# Patient Record
Sex: Female | Born: 1985 | Race: White | Hispanic: No | Marital: Single | Smoking: Current every day smoker
Health system: Southern US, Community
[De-identification: ages and names within clinical notes are randomized; demographics above are authoritative.]

## PROBLEM LIST (undated history)

## (undated) ENCOUNTER — Inpatient Hospital Stay (HOSPITAL_COMMUNITY): Payer: Self-pay

## (undated) DIAGNOSIS — B999 Unspecified infectious disease: Secondary | ICD-10-CM

## (undated) DIAGNOSIS — R87619 Unspecified abnormal cytological findings in specimens from cervix uteri: Secondary | ICD-10-CM

## (undated) DIAGNOSIS — N390 Urinary tract infection, site not specified: Secondary | ICD-10-CM

## (undated) DIAGNOSIS — IMO0002 Reserved for concepts with insufficient information to code with codable children: Secondary | ICD-10-CM

## (undated) HISTORY — PX: INDUCED ABORTION: SHX677

---
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
## 2005-01-04 ENCOUNTER — Ambulatory Visit (HOSPITAL_COMMUNITY): Admission: RE | Admit: 2005-01-04 | Discharge: 2005-01-04 | Payer: Self-pay | Admitting: Obstetrics & Gynecology

## 2005-04-25 ENCOUNTER — Ambulatory Visit (HOSPITAL_COMMUNITY): Admission: RE | Admit: 2005-04-25 | Discharge: 2005-04-25 | Payer: Self-pay | Admitting: Obstetrics & Gynecology

## 2005-06-02 ENCOUNTER — Inpatient Hospital Stay (HOSPITAL_COMMUNITY): Admission: AD | Admit: 2005-06-02 | Discharge: 2005-06-04 | Payer: Self-pay | Admitting: Obstetrics & Gynecology

## 2005-06-29 ENCOUNTER — Emergency Department (HOSPITAL_COMMUNITY): Admission: EM | Admit: 2005-06-29 | Discharge: 2005-06-29 | Payer: Self-pay | Admitting: Emergency Medicine

## 2005-11-22 ENCOUNTER — Emergency Department (HOSPITAL_COMMUNITY): Admission: EM | Admit: 2005-11-22 | Discharge: 2005-11-22 | Payer: Self-pay | Admitting: Emergency Medicine

## 2006-03-29 ENCOUNTER — Inpatient Hospital Stay (HOSPITAL_COMMUNITY): Admission: AD | Admit: 2006-03-29 | Discharge: 2006-03-29 | Payer: Self-pay | Admitting: Obstetrics & Gynecology

## 2007-08-18 ENCOUNTER — Emergency Department (HOSPITAL_COMMUNITY): Admission: EM | Admit: 2007-08-18 | Discharge: 2007-08-18 | Payer: Self-pay | Admitting: Family Medicine

## 2007-08-18 ENCOUNTER — Inpatient Hospital Stay (HOSPITAL_COMMUNITY): Admission: AD | Admit: 2007-08-18 | Discharge: 2007-08-19 | Payer: Self-pay | Admitting: Obstetrics & Gynecology

## 2008-02-13 ENCOUNTER — Inpatient Hospital Stay (HOSPITAL_COMMUNITY): Admission: AD | Admit: 2008-02-13 | Discharge: 2008-02-17 | Payer: Self-pay | Admitting: Obstetrics

## 2008-09-07 ENCOUNTER — Inpatient Hospital Stay (HOSPITAL_COMMUNITY): Admission: AD | Admit: 2008-09-07 | Discharge: 2008-09-08 | Payer: Self-pay | Admitting: Obstetrics & Gynecology

## 2009-06-21 ENCOUNTER — Ambulatory Visit (HOSPITAL_COMMUNITY): Admission: RE | Admit: 2009-06-21 | Discharge: 2009-06-21 | Payer: Self-pay | Admitting: Obstetrics

## 2011-03-31 NOTE — Discharge Summary (Signed)
NAMETIFFINE, HENIGAN NO.:  [id number]   MEDICAL RECORD NO.:  [id number]          PATIENT TYPE:  INP   LOCATION:  9317                          FACILITY:  [facility]   PHYSICIAN:  Roseanna Rainbow, M.D.DATE OF BIRTH:  [birth year]   DATE OF ADMISSION:  02/13/2008  DATE OF DISCHARGE:  02/17/2008                               DISCHARGE SUMMARY   CHIEF COMPLAINT:  The patient is a 24 year old gravida 4, para 2-0-2-2  with lower abdominal pain.   HISTORY OF PRESENT ILLNESS:  She reports a several-day history of  similar complaints.  She denies fever.   SOCIAL HISTORY:  She reports one-half pack per day tobacco use and  occasional marijuana use.   FAMILY HISTORY:  Noncontributory.   ALLERGIES:  No known drug allergies.   PHYSICAL EXAMINATION:  GENERAL:  Well-developed, well-nourished, no  apparent distress.  HEENT:  Normocephalic and atraumatic.  NECK:  Supple.  No thyromegaly.  LUNGS:  Clear to auscultation bilaterally.  HEART:  Regular rate and rhythm.  ABDOMEN:  Tender throughout.  PELVIC:  Deferred.  EXTREMITIES:  No clubbing, cyanosis, or edema.  SKIN:  Without rash.  NEUROLOGIC:  Nonfocal.   LABORATORY DATA:  White blood cell count 19,000.   ASSESSMENT:  Pelvic inflammatory disease.   PLAN:  Admission and parenteral antibiotics.  The patient was admitted  and started on ampicillin parenterally.  This regimen was then changed  to cefoxitin and doxycycline.  Clinically, she improved.  She remained  afebrile.  On February 14, 2008, pelvic ultrasound demonstrated a small  amount of fluid within the endometrial canal; otherwise unremarkable.  A  GC DNA probe was positive.  The Chlamydia probe was negative.  On wet  prep, there were many WBCs.  She denied an HIV test as well as an RPR.  She was counseled regarding contraception as well.  She was discharged  to home on February 17, 2008, with an improvement in the previously  documented leukocytosis.   DISCHARGE  DIAGNOSIS:  Pelvic inflammatory disease, acute.   CONDITION:  Improved.   DIET:  Regular.   ACTIVITY:  Pelvic rest.   MEDICATIONS:  Doxycycline, Flagyl, and Loestrin.   DISPOSITION:  The patient was to follow up in the office in 2 weeks.      Roseanna Rainbow, M.D.  Electronically Signed     LAJ/MEDQ  D:  03/07/2008  T:  03/08/2008  Job:  [id number]

## 2011-08-08 LAB — WET PREP, GENITAL
Clue Cells Wet Prep HPF POC: NONE SEEN
Trich, Wet Prep: NONE SEEN

## 2011-08-08 LAB — GC/CHLAMYDIA PROBE AMP, GENITAL
Chlamydia, DNA Probe: NEGATIVE
GC Probe Amp, Genital: POSITIVE — AB

## 2011-08-08 LAB — URINALYSIS, ROUTINE W REFLEX MICROSCOPIC
Nitrite: NEGATIVE
Protein, ur: 30 — AB
Urobilinogen, UA: 0.2

## 2011-08-08 LAB — CBC
MCHC: 33.7
MCV: 80.2
RBC: 4.53
RDW: 15.7 — ABNORMAL HIGH
WBC: 19.3 — ABNORMAL HIGH

## 2011-08-08 LAB — DIFFERENTIAL
Basophils Absolute: 0
Basophils Relative: 0
Eosinophils Absolute: 0
Monocytes Absolute: 0.6
Monocytes Relative: 3
Neutro Abs: 18 — ABNORMAL HIGH
Neutrophils Relative %: 93 — ABNORMAL HIGH

## 2011-08-14 LAB — URINALYSIS, ROUTINE W REFLEX MICROSCOPIC
Glucose, UA: NEGATIVE
Hgb urine dipstick: NEGATIVE
Ketones, ur: NEGATIVE
Protein, ur: NEGATIVE

## 2011-08-14 LAB — WET PREP, GENITAL: Yeast Wet Prep HPF POC: NONE SEEN

## 2011-08-14 LAB — GC/CHLAMYDIA PROBE AMP, GENITAL
Chlamydia, DNA Probe: POSITIVE — AB
GC Probe Amp, Genital: NEGATIVE

## 2011-08-14 LAB — URINE MICROSCOPIC-ADD ON

## 2011-08-24 LAB — URINALYSIS, ROUTINE W REFLEX MICROSCOPIC
Bilirubin Urine: NEGATIVE
Glucose, UA: NEGATIVE
Hgb urine dipstick: NEGATIVE
Ketones, ur: NEGATIVE
pH: 6.5

## 2011-08-24 LAB — POCT PREGNANCY, URINE
Operator id: 251141
Preg Test, Ur: NEGATIVE

## 2011-08-24 LAB — URINE MICROSCOPIC-ADD ON

## 2011-08-24 LAB — WET PREP, GENITAL: Yeast Wet Prep HPF POC: NONE SEEN

## 2011-08-24 LAB — GC/CHLAMYDIA PROBE AMP, GENITAL
Chlamydia, DNA Probe: NEGATIVE
GC Probe Amp, Genital: NEGATIVE

## 2011-12-18 ENCOUNTER — Encounter (HOSPITAL_COMMUNITY): Payer: Self-pay

## 2011-12-18 ENCOUNTER — Emergency Department (HOSPITAL_COMMUNITY)
Admission: EM | Admit: 2011-12-18 | Discharge: 2011-12-18 | Disposition: A | Discharge status: Home / Self Care | Payer: Self-pay | Attending: Emergency Medicine | Admitting: Emergency Medicine

## 2011-12-18 ENCOUNTER — Emergency Department (HOSPITAL_COMMUNITY): Payer: Self-pay

## 2011-12-18 ENCOUNTER — Other Ambulatory Visit: Payer: Self-pay

## 2011-12-18 DIAGNOSIS — R0789 Other chest pain: Secondary | ICD-10-CM | POA: Insufficient documentation

## 2011-12-18 DIAGNOSIS — F17200 Nicotine dependence, unspecified, uncomplicated: Secondary | ICD-10-CM | POA: Insufficient documentation

## 2011-12-18 DIAGNOSIS — M940 Chondrocostal junction syndrome [Tietze]: Secondary | ICD-10-CM | POA: Insufficient documentation

## 2011-12-18 LAB — URINALYSIS, ROUTINE W REFLEX MICROSCOPIC
Glucose, UA: NEGATIVE mg/dL
Ketones, ur: NEGATIVE mg/dL
Leukocytes, UA: NEGATIVE
pH: 6.5 (ref 5.0–8.0)

## 2011-12-18 LAB — URINE MICROSCOPIC-ADD ON

## 2011-12-18 LAB — POCT PREGNANCY, URINE: Preg Test, Ur: NEGATIVE

## 2011-12-18 MED ORDER — KETOROLAC TROMETHAMINE 30 MG/ML IJ SOLN
30.0000 mg | Freq: Once | INTRAMUSCULAR | Status: AC
  Administered 2011-12-18: 30 mg via INTRAMUSCULAR
  Filled 2011-12-18: qty 1

## 2011-12-18 MED ORDER — IBUPROFEN 800 MG PO TABS: 800.0000 mg | ORAL_TABLET | Freq: Three times a day (TID) | ORAL | Status: AC
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
## 2011-12-18 NOTE — ED Notes (Signed)
Introduced self to pt and family.
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
## 2011-12-19 NOTE — ED Provider Notes (Signed)
Medical screening examination/treatment/procedure(s) were performed by non-physician practitioner and as supervising physician I was immediately available for consultation/collaboration.   Julyan Gales, MD 12/19/11 0715 

## 2012-01-12 NOTE — ED Provider Notes (Signed)
Medical screening examination/treatment/procedure(s) were performed by non-physician practitioner and as supervising physician I was immediately available for consultation/collaboration.  Geoffery Lyons, MD 01/12/12 [phone]

## 2012-02-01 ENCOUNTER — Encounter (HOSPITAL_COMMUNITY): Payer: Self-pay | Admitting: *Deleted

## 2012-02-01 ENCOUNTER — Inpatient Hospital Stay (HOSPITAL_COMMUNITY)
Admission: AD | Admit: 2012-02-01 | Discharge: 2012-02-01 | Disposition: A | Discharge status: Home / Self Care | Payer: Self-pay | Attending: Obstetrics & Gynecology | Admitting: Obstetrics & Gynecology

## 2012-02-01 DIAGNOSIS — N949 Unspecified condition associated with female genital organs and menstrual cycle: Secondary | ICD-10-CM | POA: Insufficient documentation

## 2012-02-01 DIAGNOSIS — A499 Bacterial infection, unspecified: Secondary | ICD-10-CM | POA: Insufficient documentation

## 2012-02-01 DIAGNOSIS — B9689 Other specified bacterial agents as the cause of diseases classified elsewhere: Secondary | ICD-10-CM | POA: Insufficient documentation

## 2012-02-01 DIAGNOSIS — L732 Hidradenitis suppurativa: Secondary | ICD-10-CM | POA: Insufficient documentation

## 2012-02-01 DIAGNOSIS — N760 Acute vaginitis: Secondary | ICD-10-CM | POA: Insufficient documentation

## 2012-02-01 LAB — URINALYSIS, ROUTINE W REFLEX MICROSCOPIC
Ketones, ur: NEGATIVE mg/dL
Leukocytes, UA: NEGATIVE
Nitrite: NEGATIVE
Protein, ur: NEGATIVE mg/dL
pH: 6 (ref 5.0–8.0)

## 2012-02-01 LAB — POCT PREGNANCY, URINE: Preg Test, Ur: NEGATIVE

## 2012-02-01 MED ORDER — METRONIDAZOLE 500 MG PO TABS: 500.0000 mg | ORAL_TABLET | Freq: Two times a day (BID) | ORAL | Status: AC

## 2012-02-01 NOTE — MAU Provider Note (Signed)
History   Rebecca [name] is a 26 y.o. year old [id number] female who presents to MAU reporting increased vaginal discharge, intermittent bilat low-mid abd pain and boils on inner thighs. She reports Hx of similar lesions in axilla.    CSN: [id number]  Arrival date & time 02/01/12  0208   None     Chief Complaint  Patient presents with  . Abdominal Pain  . Vaginal Discharge  . Recurrent Skin Infections    (Consider location/radiation/quality/duration/timing/severity/associated sxs/prior treatment) HPI  Past Medical History  Diagnosis Date  . No pertinent past medical history     No past surgical history on file.  Family History  Problem Relation Age of Onset  . Hypertension Mother   . Diabetes Father     History  Substance Use Topics  . Smoking status: Current Everyday Smoker -- 0.5 packs/day  . Smokeless tobacco: Not on file  . Alcohol Use: Yes     socially    OB History    Grav Para Term Preterm Abortions TAB SAB Ect Mult Living   6 2 2  0 4 3 1  0 0 2      Review of Systems  Constitutional: Negative for chills and fatigue.  Gastrointestinal: Positive for abdominal pain. Negative for nausea, vomiting, diarrhea and constipation.  Genitourinary: Positive for vaginal discharge and pelvic pain. Negative for dysuria, hematuria, flank pain, vaginal bleeding and menstrual problem.    Allergies  Review of patient's allergies indicates no known allergies.  Home Medications  No current outpatient prescriptions on file.  BP 143/76  Pulse 85  Temp(Src) 98.4 F (36.9 C) (Oral)  Resp 16  Ht 5\' 6"  (1.676 m)  Wt 82.01 kg (180 lb 12.8 oz)  BMI 29.18 kg/m2  LMP 01/11/2012  Physical Exam  Constitutional: She appears well-developed and well-nourished. No distress.  Cardiovascular: Normal rate.   Pulmonary/Chest: Effort normal.  Abdominal: Soft. Bowel sounds are normal. She exhibits no distension and no mass. There is tenderness. There is no rebound, no guarding, no  CVA tenderness and no tenderness at McBurney's point.    Genitourinary: There is no rash on the right labia. There is no rash on the left labia. Uterus is not enlarged and not tender. Cervix exhibits no motion tenderness, no discharge and no friability. Right adnexum displays no mass, no tenderness and no fullness. Left adnexum displays no mass, no tenderness and no fullness. No bleeding around the vagina. Vaginal discharge (thin, white, malodorous discharge) found.  Lymphadenopathy:       Right: No inguinal adenopathy present.       Left: No inguinal adenopathy present.  Skin: Skin is warm and dry. Lesion noted.       2 firm erythematous raised lesions 1. 1 cm on right inner thigh 2. 1.5 x 4 cm on right buttock    ED Course  Procedures (including critical care time)  Labs Reviewed  WET PREP, GENITAL - Abnormal; Notable for the following:    Clue Cells Wet Prep HPF POC FEW (*)    WBC, Wet Prep HPF POC FEW (*) MODERATE BACTERIA SEEN   All other components within normal limits  URINALYSIS, ROUTINE W REFLEX MICROSCOPIC  GC/CHLAMYDIA PROBE AMP, GENITAL   1. BV (bacterial vaginosis)   2. Hidradenitis suppurativa     MDM  Rx Flagyl Frequent warm soaks F/U at [facility] PRN for no improvement of Sx.  Dorathy Kinsman 02/01/2012 3:46 AM

## 2012-02-01 NOTE — Discharge Instructions (Signed)
Bacterial Vaginosis Bacterial vaginosis (BV) is a vaginal infection where the normal balance of bacteria in the vagina is disrupted. The normal balance is then replaced by an overgrowth of certain bacteria. There are several different kinds of bacteria that can cause BV. BV is the most common vaginal infection in women of childbearing age. CAUSES   The cause of BV is not fully understood. BV develops when there is an increase or imbalance of harmful bacteria.   Some activities or behaviors can upset the normal balance of bacteria in the vagina and put women at increased risk including:   Having a new sex partner or multiple sex partners.   Douching.   Using an intrauterine device (IUD) for contraception.   It is not clear what role sexual activity plays in the development of BV. However, women that have never had sexual intercourse are rarely infected with BV.  Women do not get BV from toilet seats, bedding, swimming pools or from touching objects around them.  SYMPTOMS   Grey vaginal discharge.   A fish-like odor with discharge, especially after sexual intercourse.   Itching or burning of the vagina and vulva.   Burning or pain with urination.   Some women have no signs or symptoms at all.  DIAGNOSIS  Your caregiver must examine the vagina for signs of BV. Your caregiver will perform lab tests and look at the sample of vaginal fluid through a microscope. They will look for bacteria and abnormal cells (clue cells), a pH test higher than 4.5, and a positive amine test all associated with BV.  RISKS AND COMPLICATIONS   Pelvic inflammatory disease (PID).   Infections following gynecology surgery.   Developing HIV.   Developing herpes virus.  TREATMENT  Sometimes BV will clear up without treatment. However, all women with symptoms of BV should be treated to avoid complications, especially if gynecology surgery is planned. Female partners generally do not need to be treated. However,  BV may spread between female sex partners so treatment is helpful in preventing a recurrence of BV.   BV may be treated with antibiotics. The antibiotics come in either pill or vaginal cream forms. Either can be used with nonpregnant or pregnant women, but the recommended dosages differ. These antibiotics are not harmful to the baby.   BV can recur after treatment. If this happens, a second round of antibiotics will often be prescribed.   Treatment is important for pregnant women. If not treated, BV can cause a premature delivery, especially for a pregnant woman who had a premature birth in the past. All pregnant women who have symptoms of BV should be checked and treated.   For chronic reoccurrence of BV, treatment with a type of prescribed gel vaginally twice a week is helpful.  HOME CARE INSTRUCTIONS   Finish all medication as directed by your caregiver.   Do not have sex until treatment is completed.   Tell your sexual partner that you have a vaginal infection. They should see their caregiver and be treated if they have problems, such as a mild rash or itching.   Practice safe sex. Use condoms. Only have 1 sex partner.  PREVENTION  Basic prevention steps can help reduce the risk of upsetting the natural balance of bacteria in the vagina and developing BV:  Do not have sexual intercourse (be abstinent).   Do not douche.   Use all of the medicine prescribed for treatment of BV, even if the signs and symptoms go away.     Tell your sex partner if you have BV. That way, they can be treated, if needed, to prevent reoccurrence.  SEEK MEDICAL CARE IF:   Your symptoms are not improving after 3 days of treatment.   You have increased discharge, pain, or fever.  MAKE SURE YOU:   Understand these instructions.   Will watch your condition.   Will get help right away if you are not doing well or get worse.  FOR MORE INFORMATION  Division of STD Prevention (DSTDP), Centers for Disease  Control and Prevention: www.cdc.gov/std American Social Health Association (ASHA): www.ashastd.org  Document Released: 10/30/2005 Document Revised: 10/19/2011 Document Reviewed: 04/22/2009 Exit[employer] Patient Information 2012 [employer]. 

## 2012-02-01 NOTE — MAU Note (Signed)
Pt states she has been having abdominal pain for about a month. Pt states she is having sharp pains in the stomach. Pt also complains for boils in her inner thigh that are big.

## 2012-02-01 NOTE — Progress Notes (Signed)
Cultures collected and internal vaginal exam performed

## 2012-02-01 NOTE — MAU Note (Signed)
Pt LMP 01/11/12, having abd pain x 1 month, vag discharge and 2 boils on the inside of right thigh.  No hx of MRSA.  G6 P2.

## 2012-12-19 ENCOUNTER — Inpatient Hospital Stay (HOSPITAL_COMMUNITY)
Admission: AD | Admit: 2012-12-19 | Discharge: 2012-12-19 | Disposition: A | Discharge status: Home / Self Care | Payer: Self-pay | Source: Ambulatory Visit | Attending: Obstetrics | Admitting: Obstetrics

## 2012-12-19 ENCOUNTER — Encounter (HOSPITAL_COMMUNITY): Payer: Self-pay | Admitting: *Deleted

## 2012-12-19 DIAGNOSIS — N72 Inflammatory disease of cervix uteri: Secondary | ICD-10-CM | POA: Insufficient documentation

## 2012-12-19 DIAGNOSIS — N949 Unspecified condition associated with female genital organs and menstrual cycle: Secondary | ICD-10-CM | POA: Insufficient documentation

## 2012-12-19 DIAGNOSIS — R109 Unspecified abdominal pain: Secondary | ICD-10-CM | POA: Insufficient documentation

## 2012-12-19 HISTORY — DX: Urinary tract infection, site not specified: N39.0

## 2012-12-19 HISTORY — DX: Reserved for concepts with insufficient information to code with codable children: IMO0002

## 2012-12-19 HISTORY — DX: Unspecified abnormal cytological findings in specimens from cervix uteri: R87.619

## 2012-12-19 HISTORY — DX: Unspecified infectious disease: B99.9

## 2012-12-19 LAB — URINALYSIS, ROUTINE W REFLEX MICROSCOPIC
Leukocytes, UA: NEGATIVE
Nitrite: NEGATIVE
Specific Gravity, Urine: 1.01 (ref 1.005–1.030)
pH: 6 (ref 5.0–8.0)

## 2012-12-19 LAB — WET PREP, GENITAL
Trich, Wet Prep: NONE SEEN
Yeast Wet Prep HPF POC: NONE SEEN

## 2012-12-19 MED ORDER — AZITHROMYCIN 1 G PO PACK
1.0000 g | PACK | Freq: Once | ORAL | Status: AC
  Administered 2012-12-19: 1 g via ORAL
  Filled 2012-12-19: qty 1

## 2012-12-19 NOTE — MAU Provider Note (Signed)
History    CSN: [id number]  Arrival date and time: 12/19/12 1013   First Provider Initiated Contact with Patient 12/19/12 1114      Chief Complaint  Patient presents with  . Abdominal Pain  . Vaginal Discharge   HPI Rosilyn Coachman is a 27 year old [id number] female who presents to the MAU complaining of abdominal cramping, vaginal discharge, and foul vaginal odor. Patient reports that for the last month she has had some lower abdominal cramping and clear vaginal discharge. The abdominal cramping comes and goes and can be dull or more severe. She reports that she noticed the foul vaginal odor a week ago.  She reports that she has been diagnosed with BV within the last year and has been diagnosed with BV 4-5 times in her lifetime, and is concerned that this is another episode. Patient has taken Flagyl before with only mild stomach upset. She denies any vaginal bleeding and reports a normal menstrual period on January 19th. She has one partner, and is sexually active, and is not confident on the sexual activity of her partner.  She denies any pain with urination or increased frequency. She denies fevers, nausea, vomiting, or back pain. She reports that her last pap smear was in 2012, and the results came back abnormal. Her provider recommended she have a biopsy, but the patient has not had this done.   OB History    Grav Para Term Preterm Abortions TAB SAB Ect Mult Living   6 2 2  0 4 3 1  0 0 2      Past Medical History  Diagnosis Date  . Urinary tract infection   . Abnormal Pap smear   . Infection     trich, chlamydia    Past Surgical History  Procedure Date  . Induced abortion     Family History  Problem Relation Age of Onset  . Hypertension Mother   . Diabetes Father   . Other Neg Hx     History  Substance Use Topics  . Smoking status: Current Every Day Smoker -- 0.5 packs/day for 8 years    Types: Cigarettes  . Smokeless tobacco: Never Used  . Alcohol Use: Yes   Comment: socially    Allergies: No Known Allergies  Prescriptions prior to admission  Medication Sig Dispense Refill  . GARCINIA CAMBOGIA-CHROMIUM PO Take 1 tablet by mouth daily.      Marland Kitchen ibuprofen (ADVIL,MOTRIN) 200 MG tablet Take 400 mg by mouth every 6 (six) hours as needed. For pain        Review of Systems  Constitutional: Negative for fever.  Gastrointestinal: Positive for abdominal pain. Negative for nausea, vomiting, diarrhea and constipation.  Genitourinary: Negative for dysuria and urgency.  Musculoskeletal: Positive for back pain.    Physical Exam   Blood pressure 127/72, pulse 72, temperature 97 F (36.1 C), temperature source Oral, resp. rate 16, height 5\' 5"  (1.651 m), weight 82.01 kg (180 lb 12.8 oz), last menstrual period 12/01/2012, SpO2 98.00%.  Physical Exam Physical Examination: General appearance - alert, well appearing, and in no distress and oriented to person, place, and time Abdomen - soft, nondistended, no masses or organomegaly mild pelvic tenderness noted  Pelvic - normal external genitalia, vulva, vagina, cervix, uterus and adnexa, VULVA: normal appearing vulva with no masses, tenderness or lesions VAGINA: normal appearing vagina with normal color, white/clear discharge present, no lesions, CERVIX: normal appearing cervix without discharge or lesions, DNA probe for chlamydia and GC obtained, multiparous os.  Cx friable with spotting on exam Back exam - tenderness on palpation, no CVA tenderness Extremities - peripheral pulses normal, no pedal edema, no clubbing or cyanosis  MAU Course  Procedures  Results for orders placed during the hospital encounter of 12/19/12 (from the past 24 hour(s))  URINALYSIS, ROUTINE W REFLEX MICROSCOPIC     Status: Normal   Collection Time   12/19/12 10:30 AM      Component Value Range   Color, Urine YELLOW  YELLOW   APPearance CLEAR  CLEAR   Specific Gravity, Urine 1.010  1.005 - 1.030   pH 6.0  5.0 - 8.0   Glucose, UA  NEGATIVE  NEGATIVE mg/dL   Hgb urine dipstick NEGATIVE  NEGATIVE   Bilirubin Urine NEGATIVE  NEGATIVE   Ketones, ur NEGATIVE  NEGATIVE mg/dL   Protein, ur NEGATIVE  NEGATIVE mg/dL   Urobilinogen, UA 0.2  0.0 - 1.0 mg/dL   Nitrite NEGATIVE  NEGATIVE   Leukocytes, UA NEGATIVE  NEGATIVE  POCT PREGNANCY, URINE     Status: Normal   Collection Time   12/19/12 10:38 AM      Component Value Range   Preg Test, Ur NEGATIVE  NEGATIVE  WET PREP, GENITAL     Status: Abnormal   Collection Time   12/19/12 12:05 PM      Component Value Range   Yeast Wet Prep HPF POC NONE SEEN  NONE SEEN   Trich, Wet Prep NONE SEEN  NONE SEEN   Clue Cells Wet Prep HPF POC NONE SEEN  NONE SEEN   WBC, Wet Prep HPF POC FEW (*) NONE SEEN    GC/CT sent Assessment and Plan  Vaginal discharge Cervicitis> pt. Elects presumptive tx for CT with Zithromax 1 gm po given Danae Orleans, CNM 12/19/2012 7:12 PM  Evaluation and management procedures were performed by PA-S under my supervision/collaboration. Chart reviewed, patient examined by me and I agree with management and plan.  Adela Glimpse 12/19/2012, 11:25 AM   Note to WOC for F/U appt re report of abnormal Pap done at [facility] without F/U. (Wants to F/U at [facility]).

## 2012-12-19 NOTE — MAU Note (Signed)
Pain in lower abd for past month, it is random, right now a 3, sometimes a punch of 10.  Had a d/c for a little over a wk.

## 2012-12-19 NOTE — MAU Note (Signed)
Patient states she has a history of bacterial vaginosis. States she has been having abdominal pain for about one month and having a discharge. Feels like BV.

## 2013-01-02 ENCOUNTER — Telehealth: Payer: Self-pay | Admitting: Obstetrics and Gynecology

## 2013-01-02 NOTE — Telephone Encounter (Signed)
States last Pap abnormal [facility] and was told to have biopsy so needs F/U. Please get record and schedule for colpo or Pap as indicated. They wouldn't see her without payment and I think she said she lost her insurance or MC. No ROI.Called patient so she could come in and fill out ROI in order to make her a F/U appointment.

## 2013-02-20 ENCOUNTER — Encounter (HOSPITAL_COMMUNITY): Payer: Self-pay | Admitting: *Deleted

## 2013-02-20 ENCOUNTER — Emergency Department (HOSPITAL_COMMUNITY)
Admission: EM | Admit: 2013-02-20 | Discharge: 2013-02-20 | Disposition: A | Discharge status: Home / Self Care | Payer: Self-pay | Attending: Emergency Medicine | Admitting: Emergency Medicine

## 2013-02-20 DIAGNOSIS — J029 Acute pharyngitis, unspecified: Secondary | ICD-10-CM | POA: Insufficient documentation

## 2013-02-20 DIAGNOSIS — J069 Acute upper respiratory infection, unspecified: Secondary | ICD-10-CM | POA: Insufficient documentation

## 2013-02-20 DIAGNOSIS — H9209 Otalgia, unspecified ear: Secondary | ICD-10-CM | POA: Insufficient documentation

## 2013-02-20 DIAGNOSIS — J3489 Other specified disorders of nose and nasal sinuses: Secondary | ICD-10-CM | POA: Insufficient documentation

## 2013-02-20 DIAGNOSIS — Z8619 Personal history of other infectious and parasitic diseases: Secondary | ICD-10-CM | POA: Insufficient documentation

## 2013-02-20 DIAGNOSIS — R05 Cough: Secondary | ICD-10-CM | POA: Insufficient documentation

## 2013-02-20 DIAGNOSIS — F17200 Nicotine dependence, unspecified, uncomplicated: Secondary | ICD-10-CM | POA: Insufficient documentation

## 2013-02-20 DIAGNOSIS — R059 Cough, unspecified: Secondary | ICD-10-CM | POA: Insufficient documentation

## 2013-02-20 DIAGNOSIS — Z87440 Personal history of urinary (tract) infections: Secondary | ICD-10-CM | POA: Insufficient documentation

## 2013-02-20 DIAGNOSIS — R51 Headache: Secondary | ICD-10-CM | POA: Insufficient documentation

## 2013-02-20 MED ORDER — ALBUTEROL SULFATE (5 MG/ML) 0.5% IN NEBU
2.5000 mg | INHALATION_SOLUTION | Freq: Once | RESPIRATORY_TRACT | Status: AC
  Administered 2013-02-20: 2.5 mg via RESPIRATORY_TRACT
  Filled 2013-02-20: qty 0.5

## 2013-02-20 MED ORDER — IBUPROFEN 400 MG PO TABS
400.0000 mg | ORAL_TABLET | Freq: Once | ORAL | Status: AC
  Administered 2013-02-20: 400 mg via ORAL
  Filled 2013-02-20: qty 1

## 2013-02-20 MED ORDER — HYDROCODONE-HOMATROPINE 5-1.5 MG/5ML PO SYRP: 5.0000 mL | ORAL_SOLUTION | Freq: Four times a day (QID) | ORAL | Status: DC | PRN

## 2013-02-20 NOTE — ED Notes (Signed)
Pt states she needs a work note for work. Pt states she has had cold like syptoms for the past few days runny nose, cough and her work needs a note since she called out today saying she was seen.

## 2013-02-20 NOTE — ED Provider Notes (Signed)
History    This chart was scribed for non-physician practitioner working with Suzi Roots, MD by Donne Anon, ED Scribe. This patient was seen in room TR04C/TR04C and the patient's care was started at 2306.   CSN: [id number]  Arrival date & time 02/20/13  2219   First MD Initiated Contact with Patient 02/20/13 2306      Chief Complaint  Patient presents with  . URI     The history is provided by the patient. No language interpreter was used.   Leaner ALI MCLAURIN is a 27 y.o. female who presents to the Emergency Department complaining of gradual onset, gradually worsening moderate productive cough which began 4 days ago. She reports associated sore throat, ear pain, frontal aching HA without thunderclap onset, and nasal congestion. She denies nausea, vomiting, eye pain or redness, fever, CP, SOB, ear discharge, or any other pain. She reports NyQuill and rest have improved her symptoms. She reports she has been exposed to sick contacts.  Pt is a current everyday smoker and has a h/o asthma.  Past Medical History  Diagnosis Date  . Urinary tract infection   . Abnormal Pap smear   . Infection     trich, chlamydia    Past Surgical History  Procedure Laterality Date  . Induced abortion      Family History  Problem Relation Age of Onset  . Hypertension Mother   . Diabetes Father   . Other Neg Hx     History  Substance Use Topics  . Smoking status: Current Every Day Smoker -- 0.50 packs/day for 8 years    Types: Cigarettes  . Smokeless tobacco: Never Used  . Alcohol Use: Yes     Comment: socially    OB History   Grav Para Term Preterm Abortions TAB SAB Ect Mult Living   6 2 2  0 4 3 1  0 0 2      Review of Systems  Constitutional: Negative for fever.  HENT: Positive for congestion, sore throat and sinus pressure. Negative for neck pain, neck stiffness and ear discharge.   Eyes: Negative for pain, discharge and visual disturbance.  Respiratory: Positive for cough.  Negative for shortness of breath.   Cardiovascular: Negative for chest pain.  Gastrointestinal: Negative for nausea, vomiting and abdominal pain.  Neurological: Positive for headaches. Negative for syncope.  All other systems reviewed and are negative.    Allergies  Review of patient's allergies indicates no known allergies.  Home Medications   Current Outpatient Rx  Name  Route  Sig  Dispense  Refill  . GARCINIA CAMBOGIA-CHROMIUM PO   Oral   Take 1 tablet by mouth daily.         Marland Kitchen ibuprofen (ADVIL,MOTRIN) 200 MG tablet   Oral   Take 400 mg by mouth every 6 (six) hours as needed. For pain         . HYDROcodone-homatropine (HYCODAN) 5-1.5 MG/5ML syrup   Oral   Take 5 mLs by mouth every 6 (six) hours as needed for cough.   120 mL   0     BP 153/97  Pulse 97  Temp(Src) 98.2 F (36.8 C) (Oral)  Resp 16  SpO2 97%  Physical Exam  Nursing note and vitals reviewed. Constitutional: She is oriented to person, place, and time. She appears well-developed and well-nourished. No distress.  HENT:  Head: Normocephalic and atraumatic.  Right Ear: Tympanic membrane and external ear normal.  Left Ear: External ear normal. Tympanic membrane  is retracted.  Nose: Nose normal.  Mouth/Throat: No oropharyngeal exudate.  Posterior pharyngeal erythema present. Mucus membranes moist. B/l ear canals and TMs normal without erythema; TMs without bulging, retraction, or perforation.  Eyes: Conjunctivae and EOM are normal. Pupils are equal, round, and reactive to light. No scleral icterus.  Neck: Normal range of motion. Neck supple. No tracheal deviation present.  Cardiovascular: Normal rate, regular rhythm, normal heart sounds and intact distal pulses.   Pulmonary/Chest: Effort normal and breath sounds normal. No respiratory distress. She has no wheezes. She has no rales.  Musculoskeletal: Normal range of motion.  Lymphadenopathy:    She has no cervical adenopathy.  Neurological: She is  alert and oriented to person, place, and time.  Skin: Skin is warm and dry. No rash noted. No erythema.  Psychiatric: She has a normal mood and affect. Her behavior is normal.    ED Course  Procedures (including critical care time) DIAGNOSTIC STUDIES: Oxygen Saturation is 97% on room air, normal by my interpretation.    COORDINATION OF CARE: 11:11 PM Discussed treatment plan which includes medication with pt at bedside and pt agreed to plan.    Labs Reviewed - No data to display No results found.   1. Viral URI with cough     MDM  Uncomplicated viral URI with cough. Physical exam significant for posterior pharyngeal erythema without edema or exudate as well as sinus congestion; lungs CTA and heart RRR. Patient well and nontoxic appearing with stable vital signs. Will d/c with PCP follow up and hycodan as needed for cough. Ibuprofen advised for headache and saline nasal spray/neti pot recommended for sinus congestion. Indications for ED return discussed. Patient states comfort and understanding with d/c plan with no unaddressed concerns.   I personally performed the services described in this documentation, which was scribed in my presence. The recorded information has been reviewed and is accurate.         Antony Madura, PA-C 02/22/13 2213

## 2013-02-25 NOTE — ED Provider Notes (Signed)
Medical screening examination/treatment/procedure(s) were performed by non-physician practitioner and as supervising physician I was immediately available for consultation/collaboration.   Neidy Guerrieri E Minsa Weddington, MD 02/25/13 1400 

## 2013-04-03 ENCOUNTER — Inpatient Hospital Stay (HOSPITAL_COMMUNITY)
Admission: AD | Admit: 2013-04-03 | Discharge: 2013-04-03 | Disposition: A | Discharge status: Home / Self Care | Payer: Self-pay | Source: Ambulatory Visit | Attending: Obstetrics & Gynecology | Admitting: Obstetrics & Gynecology

## 2013-04-03 ENCOUNTER — Encounter (HOSPITAL_COMMUNITY): Payer: Self-pay | Admitting: *Deleted

## 2013-04-03 DIAGNOSIS — N760 Acute vaginitis: Secondary | ICD-10-CM | POA: Insufficient documentation

## 2013-04-03 DIAGNOSIS — N949 Unspecified condition associated with female genital organs and menstrual cycle: Secondary | ICD-10-CM | POA: Insufficient documentation

## 2013-04-03 DIAGNOSIS — B9689 Other specified bacterial agents as the cause of diseases classified elsewhere: Secondary | ICD-10-CM | POA: Insufficient documentation

## 2013-04-03 DIAGNOSIS — R109 Unspecified abdominal pain: Secondary | ICD-10-CM | POA: Insufficient documentation

## 2013-04-03 DIAGNOSIS — A499 Bacterial infection, unspecified: Secondary | ICD-10-CM

## 2013-04-03 LAB — URINALYSIS, ROUTINE W REFLEX MICROSCOPIC
Bilirubin Urine: NEGATIVE
Leukocytes, UA: NEGATIVE
Nitrite: NEGATIVE
Specific Gravity, Urine: 1.01 (ref 1.005–1.030)
Urobilinogen, UA: 0.2 mg/dL (ref 0.0–1.0)

## 2013-04-03 LAB — POCT PREGNANCY, URINE: Preg Test, Ur: NEGATIVE

## 2013-04-03 LAB — WET PREP, GENITAL: Yeast Wet Prep HPF POC: NONE SEEN

## 2013-04-03 MED ORDER — METRONIDAZOLE 500 MG PO TABS: 500.0000 mg | ORAL_TABLET | Freq: Two times a day (BID) | ORAL | Status: DC

## 2013-04-03 NOTE — MAU Note (Signed)
Patient states she has been having abdominal pain for a couple of years with a history of recurrent BV. Having vaginal discharge.

## 2013-04-03 NOTE — MAU Provider Note (Signed)
Attestation of Attending Supervision of Advanced Practitioner (PA/CNM/NP): Evaluation and management procedures were performed by the Advanced Practitioner under my supervision and collaboration.  I have reviewed the Advanced Practitioner's note and chart, and I agree with the management and plan.  Ayriel Texidor, MD, FACOG Attending Obstetrician & Gynecologist Faculty Practice, [facility]  

## 2013-04-03 NOTE — Discharge Instructions (Signed)
Bacterial Vaginosis  Bacterial vaginosis is an infection of the vagina. A healthy vagina has many kinds of good germs (bacteria). Sometimes the number of good germs can change. This allows bad germs to move in and cause an infection. You may be given medicine (antibiotics) to treat the infection. Or, you may not need treatment at all.  HOME CARE   Take your medicine as told. Finish them even if you start to feel better.   Do not have sex until you finish your medicine.   Do not douche.   Practice safe sex.   Tell your sex partner that you have an infection. They should see their doctor for treatment if they have problems.  GET HELP RIGHT AWAY IF:   You do not get better after 3 days of treatment.   You have grey fluid (discharge) coming from your vagina.   You have pain.   You have a temperature of 102 F (38.9 C) or higher.  MAKE SURE YOU:    Understand these instructions.   Will watch your condition.   Will get help right away if you are not doing well or get worse.  Document Released: 08/08/2008 Document Revised: 01/22/2012 Document Reviewed: 08/08/2008  [employer] Patient Information 2014 [employer].

## 2013-04-03 NOTE — MAU Provider Note (Signed)
History     CSN: [id number]  Arrival date and time: 04/03/13 1153   First Provider Initiated Contact with Patient 04/03/13 1335      Chief Complaint  Patient presents with  . Abdominal Pain   HPI Ms. Rebecca [name] is a 27 y.o. [phone] who presents to MAU today with abdominal pain and vaginal discharge. The patient states that the pain is in the pelvic region and occasionally on the right mid-abdomen. She denies pain now. She has a vaginal discharge that is grey in color with an odor. She denies vaginal bleeding, fever, N/V, UTI symptoms, diarrhea or constipation. She does not have a PCP or GYN.    OB History   Grav Para Term Preterm Abortions TAB SAB Ect Mult Living   6 2 2  0 4 3 1  0 0 2      Past Medical History  Diagnosis Date  . Urinary tract infection   . Abnormal Pap smear   . Infection     trich, chlamydia    Past Surgical History  Procedure Laterality Date  . Induced abortion      Family History  Problem Relation Age of Onset  . Hypertension Mother   . Diabetes Father   . Other Neg Hx     History  Substance Use Topics  . Smoking status: Current Every Day Smoker -- 0.50 packs/day for 8 years    Types: Cigarettes  . Smokeless tobacco: Never Used  . Alcohol Use: Yes     Comment: socially    Allergies: No Known Allergies  No prescriptions prior to admission    Review of Systems  Constitutional: Negative for fever and malaise/fatigue.  Gastrointestinal: Positive for abdominal pain. Negative for nausea, vomiting, diarrhea and constipation.  Genitourinary: Negative for dysuria, urgency and frequency.       Neg - vaginal bleeding + vaginal discharge   Physical Exam   Blood pressure 125/80, pulse 94, temperature 98.2 F (36.8 C), temperature source Oral, resp. rate 16, height 5\' 6"  (1.676 m), weight 84.188 kg (185 lb 9.6 oz), last menstrual period 03/20/2013, SpO2 99.00%.  Physical Exam  Constitutional: She is oriented to person, place, and  time. She appears well-developed and well-nourished. No distress.  HENT:  Head: Normocephalic and atraumatic.  Cardiovascular: Normal rate, regular rhythm and normal heart sounds.   Respiratory: Effort normal and breath sounds normal. No respiratory distress.  GI: Soft. Bowel sounds are normal. She exhibits no distension and no mass. There is no tenderness. There is no rebound and no guarding.  Genitourinary: Uterus normal. Uterus is not enlarged and not tender. Cervix exhibits no motion tenderness, no discharge and no friability. Right adnexum displays no mass and no tenderness. Left adnexum displays no mass and no tenderness. Vaginal discharge (scant amount of thin, greyish discharge noted. No bleeding or odor. ) found.  Neurological: She is alert and oriented to person, place, and time.  Skin: Skin is warm and dry. No erythema.  Psychiatric: She has a normal mood and affect.   Results for orders placed during the hospital encounter of 04/03/13 (from the past 24 hour(s))  URINALYSIS, ROUTINE W REFLEX MICROSCOPIC     Status: None   Collection Time    04/03/13 12:27 PM      Result Value Range   Color, Urine YELLOW  YELLOW   APPearance CLEAR  CLEAR   Specific Gravity, Urine 1.010  1.005 - 1.030   pH 6.0  5.0 - 8.0  Glucose, UA NEGATIVE  NEGATIVE mg/dL   Hgb urine dipstick NEGATIVE  NEGATIVE   Bilirubin Urine NEGATIVE  NEGATIVE   Ketones, ur NEGATIVE  NEGATIVE mg/dL   Protein, ur NEGATIVE  NEGATIVE mg/dL   Urobilinogen, UA 0.2  0.0 - 1.0 mg/dL   Nitrite NEGATIVE  NEGATIVE   Leukocytes, UA NEGATIVE  NEGATIVE  POCT PREGNANCY, URINE     Status: None   Collection Time    04/03/13 12:30 PM      Result Value Range   Preg Test, Ur NEGATIVE  NEGATIVE  WET PREP, GENITAL     Status: Abnormal   Collection Time    04/03/13  1:43 PM      Result Value Range   Yeast Wet Prep HPF POC NONE SEEN  NONE SEEN   Trich, Wet Prep NONE SEEN  NONE SEEN   Clue Cells Wet Prep HPF POC FEW (*) NONE SEEN    WBC, Wet Prep HPF POC FEW (*) NONE SEEN    MAU Course  Procedures None  MDM UA, UPT, Wet prep, GC/Chlamydia today  Assessment and Plan  A: Bacterial vaginosis  P: Discharge home Rx for Flagyl sent to patient's pharmacy Discussed hygiene products and probiotics for avoiding recurrence of BV Patient encouraged to establish care with PCP and follow-up there if abdominal pain continues following BV treatment Patient may return to MAU as needed  Freddi Starr, PA-C  04/03/2013, 2:08 PM

## 2013-04-04 LAB — GC/CHLAMYDIA PROBE AMP: CT Probe RNA: NEGATIVE

## 2013-05-10 ENCOUNTER — Inpatient Hospital Stay (HOSPITAL_COMMUNITY)
Admission: AD | Admit: 2013-05-10 | Discharge: 2013-05-10 | Disposition: A | Discharge status: Home / Self Care | Payer: Self-pay | Source: Ambulatory Visit | Attending: Obstetrics & Gynecology | Admitting: Obstetrics & Gynecology

## 2013-05-10 ENCOUNTER — Encounter (HOSPITAL_COMMUNITY): Payer: Self-pay | Admitting: Family

## 2013-05-10 DIAGNOSIS — B3731 Acute candidiasis of vulva and vagina: Secondary | ICD-10-CM | POA: Insufficient documentation

## 2013-05-10 DIAGNOSIS — L293 Anogenital pruritus, unspecified: Secondary | ICD-10-CM | POA: Insufficient documentation

## 2013-05-10 DIAGNOSIS — B373 Candidiasis of vulva and vagina: Secondary | ICD-10-CM | POA: Insufficient documentation

## 2013-05-10 DIAGNOSIS — B379 Candidiasis, unspecified: Secondary | ICD-10-CM

## 2013-05-10 DIAGNOSIS — N949 Unspecified condition associated with female genital organs and menstrual cycle: Secondary | ICD-10-CM | POA: Insufficient documentation

## 2013-05-10 LAB — URINALYSIS, ROUTINE W REFLEX MICROSCOPIC
Hgb urine dipstick: NEGATIVE
Specific Gravity, Urine: <1.005 — ABNORMAL LOW (ref 1.005–1.030)
Urobilinogen, UA: 0.2 mg/dL (ref 0.0–1.0)
pH: 5.5 (ref 5.0–8.0)

## 2013-05-10 LAB — WET PREP, GENITAL

## 2013-05-10 LAB — POCT PREGNANCY, URINE: Preg Test, Ur: NEGATIVE

## 2013-05-10 LAB — URINE MICROSCOPIC-ADD ON

## 2013-05-10 MED ORDER — FLUCONAZOLE 150 MG PO TABS: 150.0000 mg | ORAL_TABLET | Freq: Once | ORAL | Status: DC

## 2013-05-10 NOTE — MAU Provider Note (Signed)
History     CSN: [id number]  Arrival date and time: 05/10/13 1123   First Provider Initiated Contact with Patient 05/10/13 1223      Chief Complaint  Patient presents with  . Vaginal Discharge   HPI Rebecca [name] 27 y.o.  Comes with discharge and vaginal itching since yesterday.  New sex partner 4 days ago and wants to be checked for STD.   OB History   Grav Para Term Preterm Abortions TAB SAB Ect Mult Living   6 2 2  0 4 3 1  0 0 2      Past Medical History  Diagnosis Date  . Urinary tract infection   . Abnormal Pap smear   . Infection     trich, chlamydia    Past Surgical History  Procedure Laterality Date  . Induced abortion      Family History  Problem Relation Age of Onset  . Hypertension Mother   . Diabetes Father   . Other Neg Hx     History  Substance Use Topics  . Smoking status: Current Every Day Smoker -- 0.50 packs/day for 8 years    Types: Cigarettes  . Smokeless tobacco: Never Used  . Alcohol Use: Yes     Comment: socially    Allergies: No Known Allergies  Prescriptions prior to admission  Medication Sig Dispense Refill  . metroNIDAZOLE (FLAGYL) 500 MG tablet Take 1 tablet (500 mg total) by mouth 2 (two) times daily.  14 tablet  0    Review of Systems  Constitutional: Negative for fever.  Gastrointestinal: Negative for nausea, vomiting, abdominal pain, diarrhea and constipation.  Genitourinary:       Vaginal discharge. No vaginal bleeding. No dysuria.   Physical Exam   Blood pressure 147/94, pulse 88, temperature 97.7 F (36.5 C), temperature source Oral, resp. rate 18, height 5\' 6"  (1.676 m), weight 187 lb (84.823 kg), last menstrual period 04/17/2013.  Physical Exam  Nursing note and vitals reviewed. Constitutional: She is oriented to person, place, and time. She appears well-developed and well-nourished. No distress.  HENT:  Head: Normocephalic.  Eyes: EOM are normal.  Neck: Neck supple.  GI: Soft. There is no  tenderness.  Genitourinary:  Speculum exam: Vagina - Large amount of thick discharge, no odor, vaginal erythema noted Cervix - No contact bleeding Bimanual exam: Cervix closed Uterus non tender, normal size Adnexa non tender, no masses bilaterally GC/Chlam, wet prep done Chaperone present for exam.  Musculoskeletal: Normal range of motion.  Neurological: She is alert and oriented to person, place, and time.  Skin: Skin is warm and dry.  Psychiatric: She has a normal mood and affect.    MAU Course  Procedures  MDM Results for orders placed during the hospital encounter of 05/10/13 (from the past 24 hour(s))  URINALYSIS, ROUTINE W REFLEX MICROSCOPIC     Status: Abnormal   Collection Time    05/10/13 11:30 AM      Result Value Range   Color, Urine YELLOW  YELLOW   APPearance CLEAR  CLEAR   Specific Gravity, Urine <1.005 (*) 1.005 - 1.030   pH 5.5  5.0 - 8.0   Glucose, UA NEGATIVE  NEGATIVE mg/dL   Hgb urine dipstick NEGATIVE  NEGATIVE   Bilirubin Urine NEGATIVE  NEGATIVE   Ketones, ur NEGATIVE  NEGATIVE mg/dL   Protein, ur NEGATIVE  NEGATIVE mg/dL   Urobilinogen, UA 0.2  0.0 - 1.0 mg/dL   Nitrite NEGATIVE  NEGATIVE   Leukocytes,  UA LARGE (*) NEGATIVE  URINE MICROSCOPIC-ADD ON     Status: Abnormal   Collection Time    05/10/13 11:30 AM      Result Value Range   Squamous Epithelial / LPF FEW (*) RARE   WBC, UA 3-6  <3 WBC/hpf   Urine-Other FEW YEAST    POCT PREGNANCY, URINE     Status: None   Collection Time    05/10/13 11:45 AM      Result Value Range   Preg Test, Ur NEGATIVE  NEGATIVE  WET PREP, GENITAL     Status: Abnormal   Collection Time    05/10/13 12:00 PM      Result Value Range   Yeast Wet Prep HPF POC MANY (*) NONE SEEN   Trich, Wet Prep NONE SEEN  NONE SEEN   Clue Cells Wet Prep HPF POC MODERATE (*) NONE SEEN   WBC, Wet Prep HPF POC MODERATE (*) NONE SEEN    Assessment and Plan  Yeast infection  Plan Rx diflucan 150 mg One by mouth as a single  dose May use OTC monistat vaginal cream if desired. Cultures pending and will notify if needs additional treatment.   BURLESON,TERRI 05/10/2013, 12:24 PM

## 2013-05-10 NOTE — MAU Note (Signed)
Pt reports having a thick milky vag discharge and irritation. New partner had intercourse 4 days ago and symptoms started.

## 2013-05-10 NOTE — MAU Note (Addendum)
Patient presents to MAU with c/o new sexual partner 4 days ago; noticed thick white vaginal discharge, with itching and burning.  Reports she was seen about one month ago and treated for BV.

## 2013-05-11 LAB — GC/CHLAMYDIA PROBE AMP: CT Probe RNA: NEGATIVE

## 2013-05-11 NOTE — MAU Provider Note (Signed)
Attestation of Attending Supervision of Advanced Practitioner (CNM/NP): Evaluation and management procedures were performed by the Advanced Practitioner under my supervision and collaboration.  I have reviewed the Advanced Practitioner's note and chart, and I agree with the management and plan.  Briahnna Harries 05/11/2013 6:21 AM

## 2013-10-29 ENCOUNTER — Inpatient Hospital Stay (HOSPITAL_COMMUNITY)
Admission: AD | Admit: 2013-10-29 | Discharge: 2013-10-29 | Disposition: A | Discharge status: Home / Self Care | Payer: Medicaid Other | Source: Ambulatory Visit | Attending: Obstetrics & Gynecology | Admitting: Obstetrics & Gynecology

## 2013-10-29 ENCOUNTER — Encounter (HOSPITAL_COMMUNITY): Payer: Self-pay

## 2013-10-29 DIAGNOSIS — B9689 Other specified bacterial agents as the cause of diseases classified elsewhere: Secondary | ICD-10-CM | POA: Insufficient documentation

## 2013-10-29 DIAGNOSIS — F17200 Nicotine dependence, unspecified, uncomplicated: Secondary | ICD-10-CM

## 2013-10-29 DIAGNOSIS — A499 Bacterial infection, unspecified: Secondary | ICD-10-CM | POA: Insufficient documentation

## 2013-10-29 DIAGNOSIS — N760 Acute vaginitis: Secondary | ICD-10-CM | POA: Insufficient documentation

## 2013-10-29 DIAGNOSIS — N949 Unspecified condition associated with female genital organs and menstrual cycle: Secondary | ICD-10-CM | POA: Insufficient documentation

## 2013-10-29 DIAGNOSIS — R109 Unspecified abdominal pain: Secondary | ICD-10-CM | POA: Insufficient documentation

## 2013-10-29 LAB — WET PREP, GENITAL: Yeast Wet Prep HPF POC: NONE SEEN

## 2013-10-29 LAB — POCT PREGNANCY, URINE: Preg Test, Ur: NEGATIVE

## 2013-10-29 LAB — URINALYSIS, ROUTINE W REFLEX MICROSCOPIC
Nitrite: NEGATIVE
Specific Gravity, Urine: 1.02 (ref 1.005–1.030)
Urobilinogen, UA: 0.2 mg/dL (ref 0.0–1.0)

## 2013-10-29 MED ORDER — METRONIDAZOLE 500 MG PO TABS: 500.0000 mg | ORAL_TABLET | Freq: Two times a day (BID) | ORAL | Status: DC

## 2013-10-29 NOTE — MAU Provider Note (Signed)
History     CSN: [id number]  Arrival date and time: 10/29/13 1009   First Provider Initiated Contact with Patient 10/29/13 1123      No chief complaint on file.  HPI Comments: Rebecca [name] 27 y.o. [id number] presents to MAU for vaginal discharge that she feels is a recurrent issue. She refuses to have a speculum or biman exam, just wants medications. Not sexually active for last 4 months. Has same partner.      Past Medical History  Diagnosis Date  . Urinary tract infection   . Abnormal Pap smear   . Infection     trich, chlamydia    Past Surgical History  Procedure Laterality Date  . Induced abortion      Family History  Problem Relation Age of Onset  . Hypertension Mother   . Diabetes Father   . Other Neg Hx     History  Substance Use Topics  . Smoking status: Current Every Day Smoker -- 0.50 packs/day for 8 years    Types: Cigarettes  . Smokeless tobacco: Never Used  . Alcohol Use: Yes     Comment: socially    Allergies: No Known Allergies  Prescriptions prior to admission  Medication Sig Dispense Refill  . GARCINIA CAMBOGIA-CHROMIUM PO Take 1 tablet by mouth 2 (two) times daily. Weight loss        Review of Systems  Constitutional: Negative.   HENT: Negative.   Eyes: Negative.   Respiratory: Negative.   Gastrointestinal: Negative.   Genitourinary:       Vaginal discharge with odor  Skin: Negative.    Physical Exam   Blood pressure 123/84, pulse 88, temperature 97.9 F (36.6 C), temperature source Oral, resp. rate 18, height 5\' 6"  (1.676 m), weight 86.183 kg (190 lb), last menstrual period 10/07/2013, SpO2 99.00%.  Physical Exam  Constitutional: She appears well-developed and well-nourished.  HENT:  Head: Normocephalic.  Genitourinary: Vagina normal.  Refused speculum exam, was allowed to use swab only  Musculoskeletal: Normal range of motion.  Neurological: She is alert.  Skin: Skin is warm.  Psychiatric: Her affect is angry.  Angry  she had to wait 2 hours to be seen   Results for orders placed during the hospital encounter of 10/29/13 (from the past 24 hour(s))  URINALYSIS, ROUTINE W REFLEX MICROSCOPIC     Status: None   Collection Time    10/29/13 11:00 AM      Result Value Range   Color, Urine YELLOW  YELLOW   APPearance CLEAR  CLEAR   Specific Gravity, Urine 1.020  1.005 - 1.030   pH 6.5  5.0 - 8.0   Glucose, UA NEGATIVE  NEGATIVE mg/dL   Hgb urine dipstick NEGATIVE  NEGATIVE   Bilirubin Urine NEGATIVE  NEGATIVE   Ketones, ur NEGATIVE  NEGATIVE mg/dL   Protein, ur NEGATIVE  NEGATIVE mg/dL   Urobilinogen, UA 0.2  0.0 - 1.0 mg/dL   Nitrite NEGATIVE  NEGATIVE   Leukocytes, UA NEGATIVE  NEGATIVE  POCT PREGNANCY, URINE     Status: None   Collection Time    10/29/13 11:00 AM      Result Value Range   Preg Test, Ur NEGATIVE  NEGATIVE  WET PREP, GENITAL     Status: Abnormal   Collection Time    10/29/13 12:02 PM      Result Value Range   Yeast Wet Prep HPF POC NONE SEEN  NONE SEEN   Trich, Wet Prep NONE SEEN  NONE SEEN   Clue Cells Wet Prep HPF POC FEW (*) NONE SEEN   WBC, Wet Prep HPF POC FEW (*) NONE SEEN    MAU Course  Procedures  MDM Wet prep only  Assessment and Plan   A: BV P: Flagyl 500 mg po bid x 7 days Pt advised to follow up with GYN for ongoing vaginal discharge  Carolynn Serve 10/29/2013, 12:19 PM

## 2013-10-29 NOTE — MAU Note (Signed)
Patient is in with c/o recurrent odorous vaginal discharge (she states that last time she was here she was dx with BV and did not get relief). She states that she have not had intercourse since then (does not want a speculum exam, because she believes that she does not have any STD.) patient also c/o lower abdominal pain that is intermittent but mild.

## 2014-04-02 ENCOUNTER — Inpatient Hospital Stay (HOSPITAL_COMMUNITY)
Admission: AD | Admit: 2014-04-02 | Discharge: 2014-04-03 | Disposition: A | Discharge status: Home / Self Care | Payer: Medicaid Other | Source: Ambulatory Visit | Attending: Obstetrics & Gynecology | Admitting: Obstetrics & Gynecology

## 2014-04-02 ENCOUNTER — Encounter (HOSPITAL_COMMUNITY): Payer: Self-pay | Admitting: *Deleted

## 2014-04-02 DIAGNOSIS — F17200 Nicotine dependence, unspecified, uncomplicated: Secondary | ICD-10-CM | POA: Insufficient documentation

## 2014-04-02 DIAGNOSIS — Z3201 Encounter for pregnancy test, result positive: Secondary | ICD-10-CM

## 2014-04-02 DIAGNOSIS — N912 Amenorrhea, unspecified: Secondary | ICD-10-CM | POA: Insufficient documentation

## 2014-04-02 DIAGNOSIS — N911 Secondary amenorrhea: Secondary | ICD-10-CM

## 2014-04-02 LAB — POCT PREGNANCY, URINE: PREG TEST UR: POSITIVE — AB

## 2014-04-02 LAB — URINALYSIS, ROUTINE W REFLEX MICROSCOPIC
Bilirubin Urine: NEGATIVE
Glucose, UA: NEGATIVE mg/dL
HGB URINE DIPSTICK: NEGATIVE
Ketones, ur: NEGATIVE mg/dL
LEUKOCYTES UA: NEGATIVE
NITRITE: NEGATIVE
PROTEIN: NEGATIVE mg/dL
SPECIFIC GRAVITY, URINE: 1.02 (ref 1.005–1.030)
UROBILINOGEN UA: 0.2 mg/dL (ref 0.0–1.0)
pH: 6 (ref 5.0–8.0)

## 2014-04-02 NOTE — MAU Note (Signed)
PT  SAYS WAS LMP - 4-8.      LAST SEX-   Tuesday.      NO BIRTH CONTROL.   HAS  2 HPT-  POSTIVE.      DR J-M   DELIVERED OTHER BABIES.    WILL GET [place]   . WANTS TO KNOW IF UPT.

## 2014-04-03 ENCOUNTER — Encounter (HOSPITAL_COMMUNITY): Payer: Self-pay | Admitting: Advanced Practice Midwife

## 2014-04-03 DIAGNOSIS — N912 Amenorrhea, unspecified: Secondary | ICD-10-CM

## 2014-04-03 MED ORDER — PNV PRENATAL PLUS MULTIVITAMIN 27-1 MG PO TABS: 1.0000 | ORAL_TABLET | Freq: Every day | ORAL | Status: DC

## 2014-04-03 NOTE — Discharge Instructions (Signed)
Prenatal Care Providers °[facility]    [facility] ° Phone- [phone]     Phone: [phone] °         °[facility]                      [facility] ° @[place]     Phone: [phone] ° Phone: [phone] °        [facility] °[facility]     Phone: [phone] ° Phone: [phone]   °        [facility] °[facility] [place]                hone: [phone] ° Phone: [phone] °        [facility] °Dr. Bernard Marshall      Phone: [phone] ° Phone: [phone] °        [facility] °[facility]                Phone: [phone] ° [facility]  ° Phone:[phone]    [facility]) °         Phone: [phone] °[facility] °  Phone: [phone] - First Trimester °During sexual intercourse, millions of sperm go into the vagina. Only 1 sperm will penetrate and fertilize the female egg while it is in the Fallopian tube. One week later, the fertilized egg implants into the wall of the uterus. An embryo begins to develop into a baby. At 6 to 8 weeks, the eyes and face are formed and the heartbeat can be seen on ultrasound. At the end of 12 weeks (first trimester), all the baby's organs are formed. Now that you are pregnant, you will want to do everything you can to have a healthy baby. Two of the most important things are to get good prenatal care and follow your caregiver's instructions. Prenatal care is all the medical care you receive before the baby's birth. It is given to prevent, find, and treat problems during the pregnancy and childbirth. °PRENATAL EXAMS °· During prenatal visits, your weight, blood pressure, and urine are checked. This is done to make sure you are healthy and progressing normally during the pregnancy. °· A pregnant woman should gain 25 to 35 pounds during the pregnancy. However, if you are overweight or  underweight, your caregiver will advise you regarding your weight. °· Your caregiver will ask and answer questions for you. °· Blood work, cervical cultures, other necessary tests, and a Pap test are done during your prenatal exams. These tests are done to check on your health and the probable health of your baby. Tests are strongly recommended and done for HIV with your permission. This is the virus that causes AIDS. These tests are done because medicines can be given to help prevent your baby from being born with this infection should you have been infected without knowing it. Blood work is also used to find out your blood type, previous infections, and follow your blood levels (hemoglobin). °· Low hemoglobin (anemia) is common during pregnancy. Iron and vitamins are given to help prevent this. Later in the pregnancy, blood tests for diabetes will be done along with any other tests if any problems develop. °· You may need other tests to make sure you and the baby are doing well. °CHANGES DURING THE FIRST TRIMESTER  °Your body   goes through many changes during pregnancy. They vary from person to person. Talk to your caregiver about changes you notice and are concerned about. Changes can include: °· Your menstrual period stops. °· The egg and sperm carry the genes that determine what you look like. Genes from you and your partner are forming a baby. The female genes determine whether the baby is a boy or a girl. °· Your body increases in girth and you may feel bloated. °· Feeling sick to your stomach (nauseous) and throwing up (vomiting). If the vomiting is uncontrollable, call your caregiver. °· Your breasts will begin to enlarge and become tender. °· Your nipples may stick out more and become darker. °· The need to urinate more. Painful urination may mean you have a bladder infection. °· Tiring easily. °· Loss of appetite. °· Cravings for certain kinds of food. °· At first, you may gain or lose a couple of  pounds. °· You may have changes in your emotions from day to day (excited to be pregnant or concerned something may go wrong with the pregnancy and baby). °· You may have more vivid and strange dreams. °HOME CARE INSTRUCTIONS  °· It is very important to avoid all smoking, alcohol and non-prescribed drugs during your pregnancy. These affect the formation and growth of the baby. Avoid chemicals while pregnant to ensure the delivery of a healthy infant. °· Start your prenatal visits by the 12th week of pregnancy. They are usually scheduled monthly at first, then more often in the last 2 months before delivery. Keep your caregiver's appointments. Follow your caregiver's instructions regarding medicine use, blood and lab tests, exercise, and diet. °· During pregnancy, you are providing food for you and your baby. Eat regular, well-balanced meals. Choose foods such as meat, fish, milk and other low fat dairy products, vegetables, fruits, and whole-grain breads and cereals. Your caregiver will tell you of the ideal weight gain. °· You can help morning sickness by keeping soda crackers at the bedside. Eat a couple before arising in the morning. You may want to use the crackers without salt on them. °· Eating 4 to 5 small meals rather than 3 large meals a day also may help the nausea and vomiting. °· Drinking liquids between meals instead of during meals also seems to help nausea and vomiting. °· A physical sexual relationship may be continued throughout pregnancy if there are no other problems. Problems may be early (premature) leaking of amniotic fluid from the membranes, vaginal bleeding, or belly (abdominal) pain. °· Exercise regularly if there are no restrictions. Check with your caregiver or physical therapist if you are unsure of the safety of some of your exercises. Greater weight gain will occur in the last 2 trimesters of pregnancy. Exercising will help: °· Control your weight. °· Keep you in shape. °· Prepare you  for labor and delivery. °· Help you lose your pregnancy weight after you deliver your baby. °· Wear a good support or jogging bra for breast tenderness during pregnancy. This may help if worn during sleep too. °· Ask when prenatal classes are available. Begin classes when they are offered. °· Do not use hot tubs, steam rooms, or saunas. °· Wear your seat belt when driving. This protects you and your baby if you are in an accident. °· Avoid raw meat, uncooked cheese, cat litter boxes, and soil used by cats throughout the pregnancy. These carry germs that can cause birth defects in the baby. °· The first trimester is   a good time to visit your dentist for your dental health. Getting your teeth cleaned is okay. Use a softer toothbrush and brush gently during pregnancy. °· Ask for help if you have financial, counseling, or nutritional needs during pregnancy. Your caregiver will be able to offer counseling for these needs as well as refer you for other special needs. °· Do not take any medicines or herbs unless told by your caregiver. °· Inform your caregiver if there is any mental or physical domestic violence. °· Make a list of emergency phone numbers of family, friends, hospital, and police and fire departments. °· Write down your questions. Take them to your prenatal visit. °· Do not douche. °· Do not cross your legs. °· If you have to stand for long periods of time, rotate you feet or take small steps in a circle. °· You may have more vaginal secretions that may require a sanitary pad. Do not use tampons or scented sanitary pads. °MEDICINES AND DRUG USE IN PREGNANCY °· Take prenatal vitamins as directed. The vitamin should contain 1 milligram of folic acid. Keep all vitamins out of reach of children. Only a couple vitamins or tablets containing iron may be fatal to a baby or young child when ingested. °· Avoid use of all medicines, including herbs, over-the-counter medicines, not prescribed or suggested by your  caregiver. Only take over-the-counter or prescription medicines for pain, discomfort, or fever as directed by your caregiver. Do not use aspirin, ibuprofen, or naproxen unless directed by your caregiver. °· Let your caregiver also know about herbs you may be using. °· Alcohol is related to a number of birth defects. This includes fetal alcohol syndrome. All alcohol, in any form, should be avoided completely. Smoking will cause low birth rate and premature babies. °· Street or illegal drugs are very harmful to the baby. They are absolutely forbidden. A baby born to an addicted mother will be addicted at birth. The baby will go through the same withdrawal an adult does. °· Let your caregiver know about any medicines that you have to take and for what reason you take them. °SEEK MEDICAL CARE IF:  °You have any concerns or worries during your pregnancy. It is better to call with your questions if you feel they cannot wait, rather than worry about them. °SEEK IMMEDIATE MEDICAL CARE IF:  °· An unexplained oral temperature above 102° F (38.9° C) develops, or as your caregiver suggests. °· You have leaking of fluid from the vagina (birth canal). If leaking membranes are suspected, take your temperature and inform your caregiver of this when you call. °· There is vaginal spotting or bleeding. Notify your caregiver of the amount and how many pads are used. °· You develop a bad smelling vaginal discharge with a change in the color. °· You continue to feel sick to your stomach (nauseated) and have no relief from remedies suggested. You vomit blood or coffee ground-like materials. °· You lose more than 2 pounds of weight in 1 week. °· You gain more than 2 pounds of weight in 1 week and you notice swelling of your face, hands, feet, or legs. °· You gain 5 pounds or more in 1 week (even if you do not have swelling of your hands, face, legs, or feet). °· You get exposed to German measles and have never had them. °· You are exposed  to fifth disease or chickenpox. °· You develop belly (abdominal) pain. Round ligament discomfort is a common non-cancerous (benign) cause of   abdominal pain in pregnancy. Your caregiver still must evaluate this. °· You develop headache, fever, diarrhea, pain with urination, or shortness of breath. °· You fall or are in a car accident or have any kind of trauma. °· There is mental or physical violence in your home. °Document Released: 10/24/2001 Document Revised: 07/24/2012 Document Reviewed: 04/27/2009 °ExitCare® Patient Information ©2014 [employer]. ° °

## 2014-04-03 NOTE — MAU Provider Note (Signed)
Chief Complaint: Possible Pregnancy   First Provider Initiated Contact with Patient 04/03/2014 at [place].  SUBJECTIVE HPI: Rebecca [name] is a 28 y.o. [id number] at 103w2d by LMP who presents with request for pregnancy verification once and a due date. Denies abdominal pain or vaginal bleeding. LMP 02/18/2014, normal, regular cycles.   Past Medical History  Diagnosis Date  . Urinary tract infection   . Abnormal Pap smear   . Infection     trich, chlamydia   OB History  Gravida Para Term Preterm AB SAB TAB Ectopic Multiple Living  8 2 2  0 4 1 3  0 0 2    # Outcome Date GA Lbr Len/2nd Weight Sex Delivery Anes PTL Lv  8 CUR           7 TAB           6 TAB           5 TAB           4 SAB           3 TRM      SVD EPI N Y  2 TRM      SVD EPI N Y  1 GRA              Past Surgical History  Procedure Laterality Date  . Induced abortion     History   Social History  . Marital Status: Single    Spouse Name: N/A    Number of Children: N/A  . Years of Education: N/A   Occupational History  . Not on file.   Social History Main Topics  . Smoking status: Current Every Day Smoker -- 0.50 packs/day for 8 years    Types: Cigarettes  . Smokeless tobacco: Never Used  . Alcohol Use: No     Comment: socially  . Drug Use: No     Comment: on the weekends  . Sexual Activity: Yes    Birth Control/ Protection: Condom   Other Topics Concern  . Not on file   Social History Narrative  . No narrative on file   No current facility-administered medications on file prior to encounter.   Current Outpatient Prescriptions on File Prior to Encounter  Medication Sig Dispense Refill  . GARCINIA CAMBOGIA-CHROMIUM PO Take 1 tablet by mouth 2 (two) times daily. Weight loss       No Known Allergies  ROS: Pertinent items in HPI  OBJECTIVE Blood pressure 111/80, pulse 78, temperature 98.4 F (36.9 C), temperature source Oral, resp. rate 18, height 5\' 5"  (1.651 m), weight 88.168 kg (194 lb 6 oz),  last menstrual period 02/18/2014. GENERAL: Well-developed, well-nourished female in no acute distress.  HEENT: Normocephalic HEART: normal rate RESP: normal effort ABDOMEN: Soft, non-tender NEURO: Alert and oriented  LAB RESULTS Results for orders placed during the hospital encounter of 04/02/14 (from the past 24 hour(s))  URINALYSIS, ROUTINE W REFLEX MICROSCOPIC     Status: None   Collection Time    04/02/14 10:55 PM      Result Value Ref Range   Color, Urine YELLOW  YELLOW   APPearance CLEAR  CLEAR   Specific Gravity, Urine 1.020  1.005 - 1.030   pH 6.0  5.0 - 8.0   Glucose, UA NEGATIVE  NEGATIVE mg/dL   Hgb urine dipstick NEGATIVE  NEGATIVE   Bilirubin Urine NEGATIVE  NEGATIVE   Ketones, ur NEGATIVE  NEGATIVE mg/dL   Protein, ur NEGATIVE  NEGATIVE mg/dL  Urobilinogen, UA 0.2  0.0 - 1.0 mg/dL   Nitrite NEGATIVE  NEGATIVE   Leukocytes, UA NEGATIVE  NEGATIVE  POCT PREGNANCY, URINE     Status: Abnormal   Collection Time    04/02/14 11:10 PM      Result Value Ref Range   Preg Test, Ur POSITIVE (*) NEGATIVE    IMAGING No results found.  MAU COURSE  ASSESSMENT 1. Secondary amenorrhea   2. Pregnancy test positive     PLAN Discharge home in stable condition. Pregnancy verification letter given. List of providers given.     Follow-up Information   [facility] with [facility]      Follow up with [facility]. (As needed in emergencies)    Contact information:   [id number] [place] [address] [phone]       Medication List    STOP taking these medications       GARCINIA CAMBOGIA-CHROMIUM PO     ibuprofen 200 MG tablet  Commonly known as:  ADVIL,MOTRIN      TAKE these medications       PNV PRENATAL PLUS MULTIVITAMIN 27-1 MG Tabs  Take 1 tablet by mouth daily.       [place], [place] 04/03/2014  12:38 AM

## 2014-05-20 ENCOUNTER — Encounter: Payer: Self-pay | Admitting: Obstetrics & Gynecology

## 2014-06-17 ENCOUNTER — Ambulatory Visit (INDEPENDENT_AMBULATORY_CARE_PROVIDER_SITE_OTHER): Payer: Medicaid Other | Admitting: Obstetrics & Gynecology

## 2014-06-17 ENCOUNTER — Encounter: Payer: Self-pay | Admitting: Obstetrics & Gynecology

## 2014-06-17 VITALS — BP 110/74 | HR 91 | Temp 98.0°F | Ht 66.0 in | Wt 192.0 lb

## 2014-06-17 DIAGNOSIS — Z3482 Encounter for supervision of other normal pregnancy, second trimester: Secondary | ICD-10-CM

## 2014-06-17 DIAGNOSIS — Z3480 Encounter for supervision of other normal pregnancy, unspecified trimester: Secondary | ICD-10-CM

## 2014-06-17 DIAGNOSIS — B3731 Acute candidiasis of vulva and vagina: Secondary | ICD-10-CM

## 2014-06-17 DIAGNOSIS — B373 Candidiasis of vulva and vagina: Secondary | ICD-10-CM

## 2014-06-17 LAB — POCT URINALYSIS DIPSTICK
GLUCOSE UA: NEGATIVE
Leukocytes, UA: NEGATIVE
Nitrite, UA: NEGATIVE
SPEC GRAV UA: 1.015
Urobilinogen, UA: NEGATIVE
pH, UA: 6

## 2014-06-17 MED ORDER — TERCONAZOLE 0.4 % VA CREA: 1.0000 | TOPICAL_CREAM | Freq: Every day | VAGINAL | Status: DC

## 2014-06-17 NOTE — Progress Notes (Signed)
Subjective:    Rebecca [name] is being seen today for her first obstetrical visit.  This is not a planned pregnancy. She is at [redacted]w[redacted]d gestation. . Relationship with FOB: significant other, living together. Patient does intend to breast feed. Pregnancy history fully reviewed.    Menstrual History: OB History   Grav Para Term Preterm Abortions TAB SAB Ect Mult Living   7 2 2  0 4 3 1  0 0 2       Patient's last menstrual period was 02/18/2014.    Past Medical History  Diagnosis Date  . Urinary tract infection   . Abnormal Pap smear   . Infection     trich, chlamydia    Past Surgical History  Procedure Laterality Date  . Induced abortion       (Not in a hospital admission) No Known Allergies  History  Substance Use Topics  . Smoking status: Current Every Day Smoker -- 0.50 packs/day for 8 years    Types: Cigarettes  . Smokeless tobacco: Never Used  . Alcohol Use: No     Comment: socially    Family History  Problem Relation Age of Onset  . Hypertension Mother   . Diabetes Father   . Other Neg Hx      Review of Systems Constitutional: negative for weight loss Gastrointestinal: negative for vomiting Genitourinary:negative for genital lesions and vaginal discharge and dysuria Musculoskeletal:negative for back pain Behavioral/Psych: negative for abusive relationship, depression, illegal drug usage and tobacco use    Objective:    BP 110/74  Pulse 91  Temp(Src) 98 F (36.7 C)  Ht 5\' 6"  (1.676 m)  Wt 87.091 kg (192 lb)  BMI 31.00 kg/m2  LMP 02/18/2014 General Appearance:    Alert, cooperative, no distress, appears stated age  Head:    Normocephalic, without obvious abnormality, atraumatic  Eyes:    PERRL, conjunctiva/corneas clear, EOM's intact, fundi    benign, both eyes  Ears:    Normal TM's and external ear canals, both ears  Nose:   Nares normal, septum midline, mucosa normal, no drainage    or sinus tenderness  Throat:   Lips, mucosa, and tongue normal;  teeth and gums normal  Neck:   Supple, symmetrical, trachea midline, no adenopathy;    thyroid:  no enlargement/tenderness/nodules; no carotid   bruit or JVD  Back:     Symmetric, no curvature, ROM normal, no CVA tenderness  Lungs:     Clear to auscultation bilaterally, respirations unlabored  Chest Wall:    No tenderness or deformity   Heart:    Regular rate and rhythm, S1 and S2 normal, no murmur, rub   or gallop  Breast Exam:    No tenderness, masses, or nipple abnormality  Abdomen:     Soft, non-tender, bowel sounds active all four quadrants,    no masses, no organomegaly  Genitalia:    Normal female without lesion, discharge or tenderness  Extremities:   Extremities normal, atraumatic, no cyanosis or edema  Pulses:   2+ and symmetric all extremities  Skin:   Skin color, texture, turgor normal, no rashes or lesions  Lymph nodes:   Cervical, supraclavicular, and axillary nodes normal  Neurologic:   CNII-XII intact, normal strength, sensation and reflexes    throughout      Lab Review Urine pregnancy test Labs reviewed no Radiologic studies reviewed no Assessment:    Pregnancy at [redacted]w[redacted]d weeks  Likely candida vulvovaginitis   Plan:  Prenatal vitamins.  Counseling provided regarding continued use of seat belts, cessation of alcohol consumption, smoking or use of illicit drugs; infection precautions i.e., influenza/TDAP immunizations, toxoplasmosis,CMV, parvovirus, listeria and varicella; workplace safety, exercise during pregnancy; routine dental care, safe medications, sexual activity, hot tubs, saunas, pools, travel, caffeine use, fish and methlymercury, potential toxins, hair treatments, varicose veins Weight gain recommendations per IOM guidelines reviewed:  overweight/BMI 25 - 29.9--> gain 15 - 25 lbs Problem list reviewed and updated. CF mutation testing/QUAD SCREEN/fragile X/Spinal muscular atrophy discussed: ordered. Role of ultrasound in pregnancy discussed; fetal  survey: ordered. Amniocentesis discussed: not indicated.  Meds ordered this encounter  Medications  . terconazole (TERAZOL 7) 0.4 % vaginal cream    Sig: Place 1 applicator vaginally at bedtime.    Dispense:  45 g    Refill:  0   Orders Placed This Encounter  Procedures  . Culture, OB Urine  . WET PREP BY MOLECULAR PROBE  . US OB Comp + 14 Wk    Standing Status: Future     Number of Occurrences:      Standing Expiration Date: 08/18/2015    Order Specific Question:  Reason for Exam (SYMPTOM  OR DIAGNOSIS REQUIRED)    Answer:  anatomy    Order Specific Question:  Preferred imaging location?    Answer:  [facility]  . Obstetric panel  . HIV antibody  . Hemoglobinopathy evaluation  . Varicella zoster antibody, IgG  . Vit D  25 hydroxy (rtn osteoporosis monitoring)  . AFP, Quad Screen    Order Specific Question:  Repeat Sample    Answer:  No    Order Specific Question:  Maternal Race    Answer:  Caucasian    Order Specific Question:  EDD    Answer:  11/25/2014    Order Specific Question:  Pregnancy Donor Egg (Y/N)    Answer:  No    Order Specific Question:  Gest Age at U/S (Wk.Dy)    Answer:  N/A    Order Specific Question:  LMP:    Answer:  02/18/2014    Order Specific Question:  Number of Fetuses    Answer:  1    Order Specific Question:  Hx of OSB/NTD?    Answer:  No    Order Specific Question:  History of Down Syndrome?    Answer:  No    Order Specific Question:  Maternal IDDM (insulin-dependent diabetes mellitus)    Answer:  No    Order Specific Question:  Maternal Weight (lbs)    Answer:  192  . Hemoglobin A1c  . Cystic fibrosis diagnostic study    Order Specific Question:  Patient Ethnicity:    Answer:  Caucasian  . Fragile X w/Reflex (PCR)  . Miscellaneous test    [id number] Spinal Muscular Atrophy  . POCT urinalysis dipstick    Follow up in 6 weeks.

## 2014-06-17 NOTE — Patient Instructions (Signed)
Alpha-Fetoprotein Alpha-Fetoprotein (AFP) is a protein made by the fetal liver. It is normally elevated in the newborn and the mother. In the infant, the value falls to adult values (normally less than 20 ng/ml (nanograms per milliliter) by one year of age. This protein is found in a number of abnormal tumors and conditions. It can be used for a screening test when this is appropriate. PREPARATION FOR TEST No preparation or fasting is required. ELEVATIONS OF AFP ARE CAUSED BY:  Primary hepatocellular carcinoma (cancer of the liver) and the level correlates with tumor size.  A screening test for embryonic teratocarcinomas, hepatoblastomas (tumor of the liver).  Rarely hepatic metastases (cancer spread) from the GI tract.  Some cholangiocarcinomas cause elevations greater than 400 ng/ml.  Rapidly progressing hepatitis can produce levels of AFP in excess of 1000ng/ml.  Lesser levels of hepatitis (inflammation of the liver) can produce levels of 100 to 400 ng/ml. NORMAL FINDINGS   Adult: Less than 40 ng/mL or Less than 40 mg/L (SI units).  Child younger than 1 year: Less than 30ng/mL. Ranges are stratified by weeks of gestation and vary among laboratories. Ranges for normal findings may vary among different laboratories and hospitals. You should always check with your doctor after having lab work or other tests done to discuss the meaning of your test results and whether your values are considered within normal limits. MEANING OF TEST  Your caregiver will go over the test results with you and discuss the importance and meaning of your results, as well as treatment options and the need for additional tests if necessary. OBTAINING THE TEST RESULTS  It is your responsibility to obtain your test results. Ask the lab or department performing the test when and how you will get your results. Document Released: 11/23/2004 Document Revised: 01/22/2012 Document Reviewed: 10/04/2008 ExitCare Patient  Information 2015 [employer]. This information is not intended to replace advice given to you by your health care provider. Make sure you discuss any questions you have with your health care provider.  

## 2014-06-18 LAB — VARICELLA ZOSTER ANTIBODY, IGG: Varicella IgG: 3310 Index — ABNORMAL HIGH (ref <135.00)

## 2014-06-18 LAB — PAP IG, CT-NG, RFX HPV ASCU
Chlamydia Probe Amp: NEGATIVE
GC Probe Amp: NEGATIVE

## 2014-06-18 LAB — OBSTETRIC PANEL
ANTIBODY SCREEN: NEGATIVE
Basophils Absolute: 0 10*3/uL (ref 0.0–0.1)
Basophils Relative: 0 % (ref 0–1)
EOS PCT: 1 % (ref 0–5)
Eosinophils Absolute: 0.1 10*3/uL (ref 0.0–0.7)
HEMATOCRIT: 34.8 % — AB (ref 36.0–46.0)
Hemoglobin: 11.7 g/dL — ABNORMAL LOW (ref 12.0–15.0)
Hepatitis B Surface Ag: NEGATIVE
LYMPHS ABS: 1.3 10*3/uL (ref 0.7–4.0)
Lymphocytes Relative: 20 % (ref 12–46)
MCH: 26.7 pg (ref 26.0–34.0)
MCHC: 33.6 g/dL (ref 30.0–36.0)
MCV: 79.5 fL (ref 78.0–100.0)
MONO ABS: 0.5 10*3/uL (ref 0.1–1.0)
Monocytes Relative: 7 % (ref 3–12)
Neutro Abs: 4.7 10*3/uL (ref 1.7–7.7)
Neutrophils Relative %: 72 % (ref 43–77)
Platelets: 241 10*3/uL (ref 150–400)
RBC: 4.38 MIL/uL (ref 3.87–5.11)
RDW: 17.7 % — ABNORMAL HIGH (ref 11.5–15.5)
RH TYPE: NEGATIVE
RUBELLA: 1.24 {index} — AB (ref <0.90)
WBC: 6.5 10*3/uL (ref 4.0–10.5)

## 2014-06-18 LAB — VITAMIN D 25 HYDROXY (VIT D DEFICIENCY, FRACTURES): VIT D 25 HYDROXY: 36 ng/mL (ref 30–89)

## 2014-06-18 LAB — AFP, QUAD SCREEN
AFP: 21 [IU]/mL
Curr Gest Age: 17 wks.days
Down Syndrome Scr Risk Est: 1:1020 {titer}
HCG TOTAL: 10832 m[IU]/mL
INH: 273.8 pg/mL
Interpretation-AFP: NEGATIVE
MOM FOR AFP: 0.7
MoM for INH: 1.81
MoM for hCG: 0.59
OPEN SPINA BIFIDA: NEGATIVE
Osb Risk: <1:27300 {titer}
Tri 18 Scr Risk Est: NEGATIVE
Trisomy 18 (Edward) Syndrome Interp.: 1:20800 {titer}
uE3 Mom: 1.12
uE3 Value: 0.7 ng/mL

## 2014-06-18 LAB — WET PREP BY MOLECULAR PROBE
Candida species: NEGATIVE
Gardnerella vaginalis: NEGATIVE
TRICHOMONAS VAG: NEGATIVE

## 2014-06-18 LAB — HEMOGLOBIN A1C
HEMOGLOBIN A1C: 5.5 % (ref <5.7)
Mean Plasma Glucose: 111 mg/dL (ref <117)

## 2014-06-18 LAB — CULTURE, OB URINE
COLONY COUNT: NO GROWTH
Organism ID, Bacteria: NO GROWTH

## 2014-06-18 LAB — HIV ANTIBODY (ROUTINE TESTING W REFLEX): HIV: NONREACTIVE

## 2014-06-19 ENCOUNTER — Telehealth: Payer: Self-pay

## 2014-06-19 LAB — HEMOGLOBINOPATHY EVALUATION
HGB S QUANTITAION: 0 %
Hemoglobin Other: 0 %
Hgb A2 Quant: 2.4 % (ref 2.2–3.2)
Hgb A: 97.6 % (ref 96.8–97.8)
Hgb F Quant: 0 % (ref 0.0–2.0)

## 2014-06-19 NOTE — Telephone Encounter (Signed)
told patient about appt for u/s at [facility] on 06/24/14

## 2014-06-20 ENCOUNTER — Encounter: Payer: Self-pay | Admitting: Obstetrics & Gynecology

## 2014-06-24 ENCOUNTER — Ambulatory Visit (HOSPITAL_COMMUNITY)
Admission: RE | Admit: 2014-06-24 | Discharge: 2014-06-24 | Disposition: A | Discharge status: Home / Self Care | Payer: Medicaid Other | Source: Ambulatory Visit | Attending: Obstetrics & Gynecology | Admitting: Obstetrics & Gynecology

## 2014-06-24 ENCOUNTER — Encounter (HOSPITAL_COMMUNITY): Payer: Self-pay

## 2014-06-24 DIAGNOSIS — Z3689 Encounter for other specified antenatal screening: Secondary | ICD-10-CM | POA: Diagnosis not present

## 2014-06-24 DIAGNOSIS — Z3482 Encounter for supervision of other normal pregnancy, second trimester: Secondary | ICD-10-CM

## 2014-06-30 ENCOUNTER — Telehealth: Payer: Self-pay

## 2014-06-30 ENCOUNTER — Other Ambulatory Visit: Payer: Self-pay | Admitting: *Deleted

## 2014-06-30 DIAGNOSIS — Z1389 Encounter for screening for other disorder: Secondary | ICD-10-CM

## 2014-06-30 NOTE — Telephone Encounter (Signed)
TOLD PATIENT U/S AT [facility], 9/9/ @ 1:15PM

## 2014-07-22 ENCOUNTER — Ambulatory Visit (HOSPITAL_COMMUNITY)
Admission: RE | Admit: 2014-07-22 | Discharge: 2014-07-22 | Disposition: A | Discharge status: Home / Self Care | Payer: Medicaid Other | Source: Ambulatory Visit | Attending: Obstetrics & Gynecology | Admitting: Obstetrics & Gynecology

## 2014-07-22 DIAGNOSIS — Z1389 Encounter for screening for other disorder: Secondary | ICD-10-CM | POA: Diagnosis present

## 2014-07-22 DIAGNOSIS — Z363 Encounter for antenatal screening for malformations: Secondary | ICD-10-CM | POA: Insufficient documentation

## 2014-07-22 DIAGNOSIS — Z3689 Encounter for other specified antenatal screening: Secondary | ICD-10-CM

## 2014-07-22 IMAGING — US US OB FOLLOW-UP
1 series · 12 of 28 positions shown · non-contrast
Comparison: none

[Series 1: us ob follow-up · 36 acquisitions, 12 frames shown]
[im 2/36]
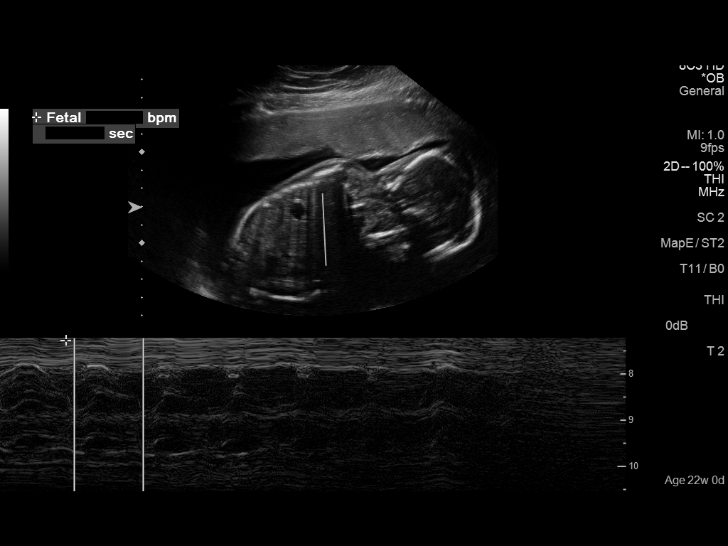
[im 4/36]
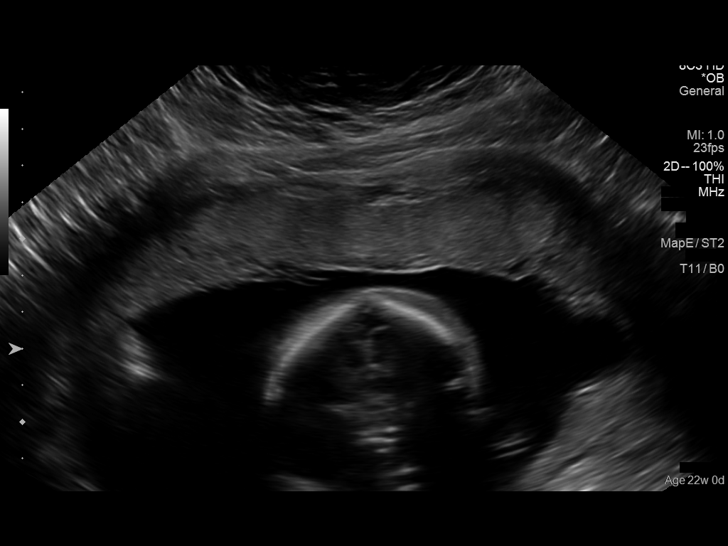
[im 7/36]
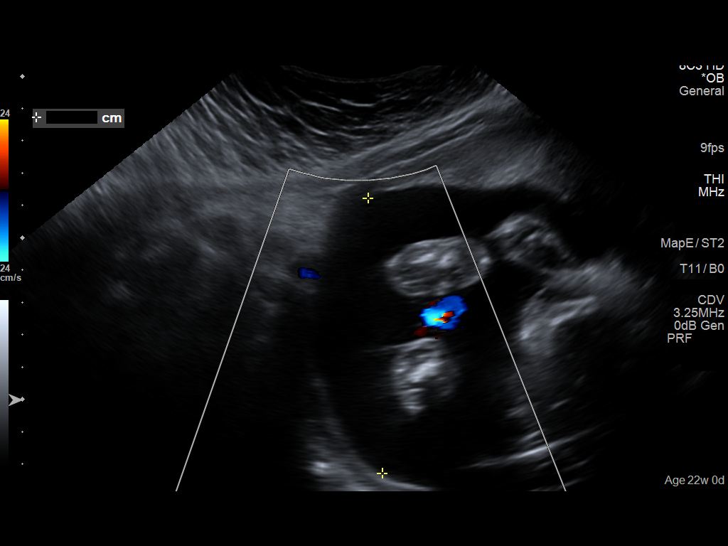
[im 11/36]
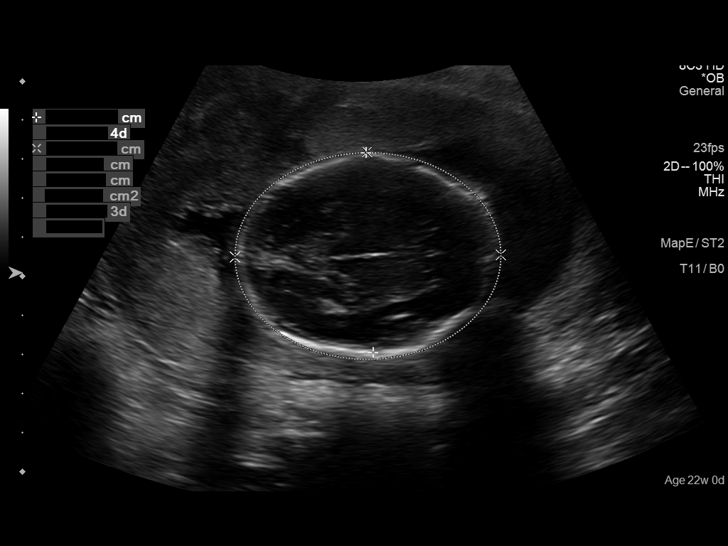
[im 13/36]
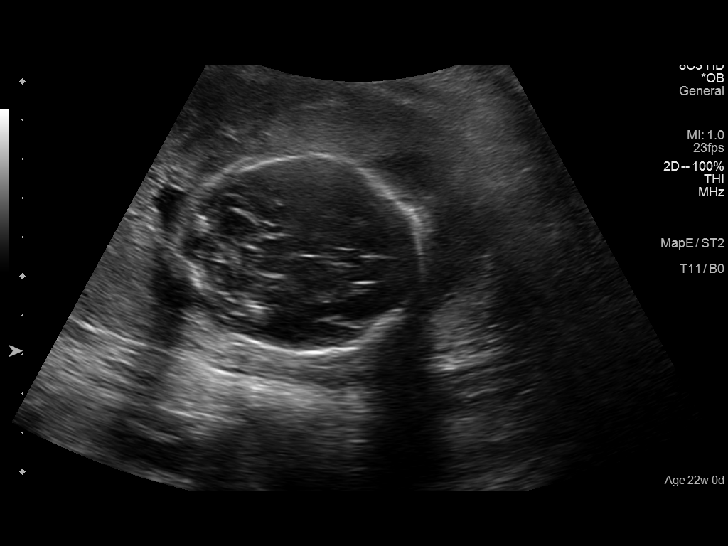
[im 16/36]
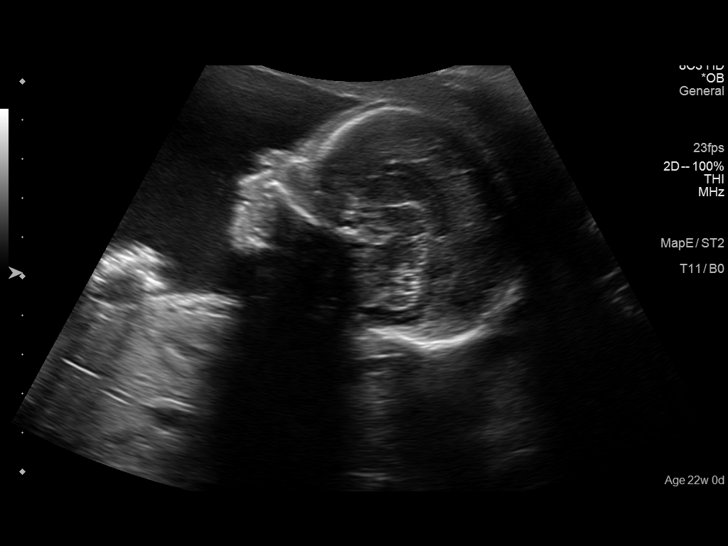
[im 20/36]
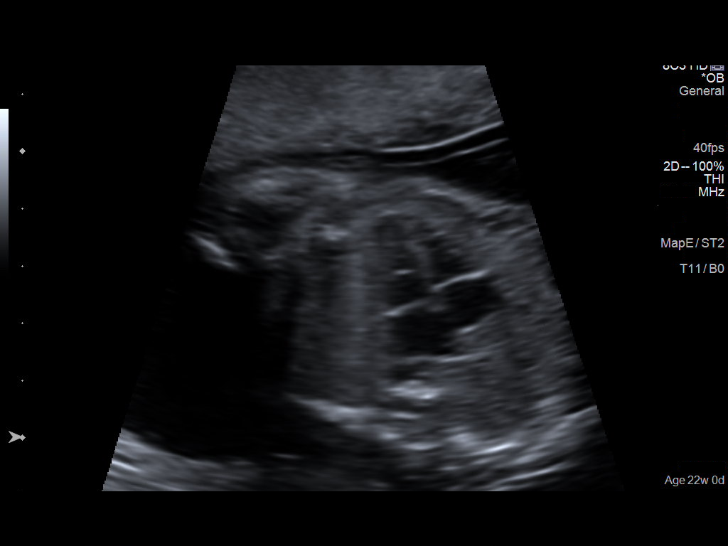
[im 23/36]
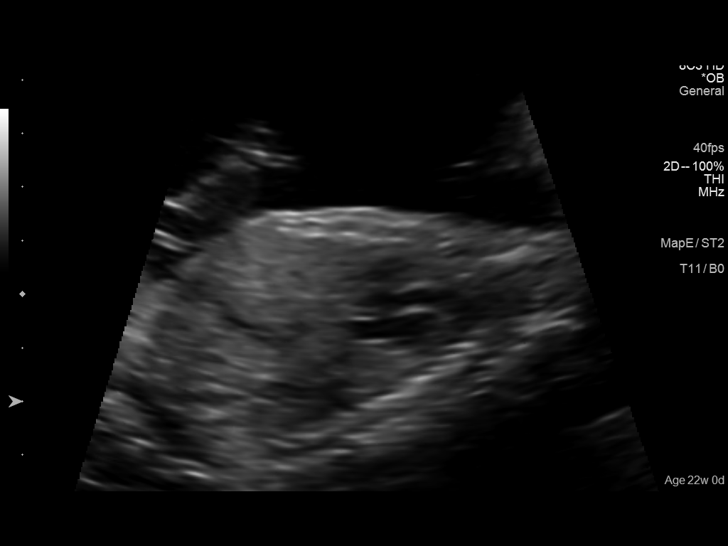
[im 25/36]
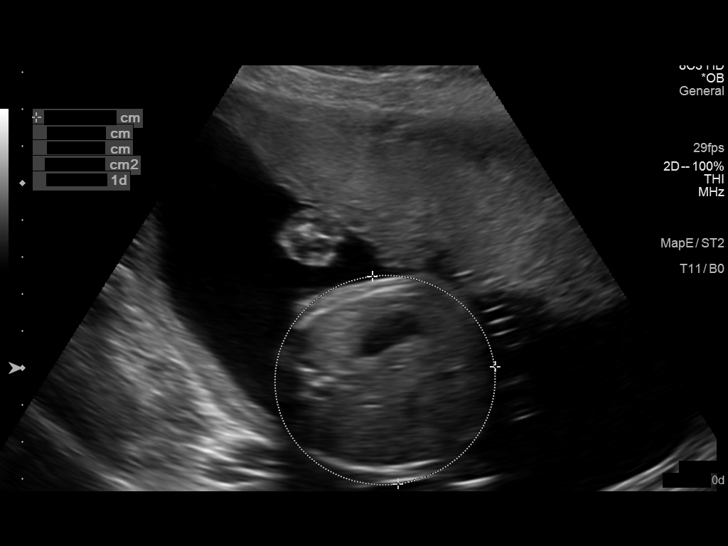
[im 29/36]
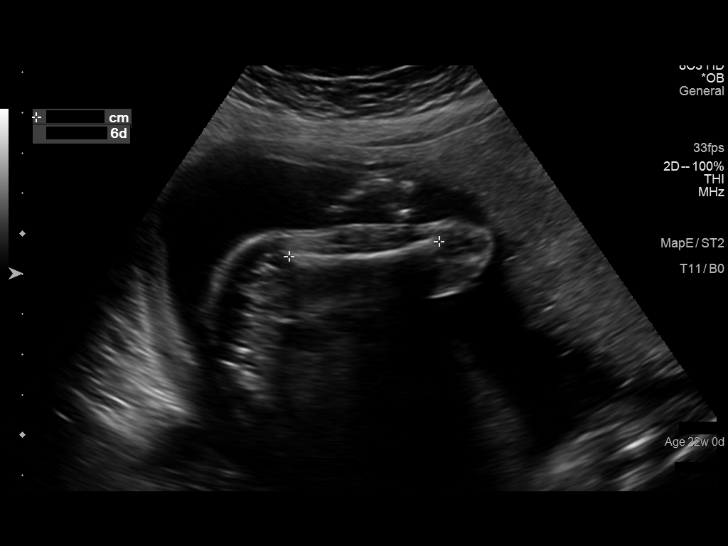
[im 32/36]
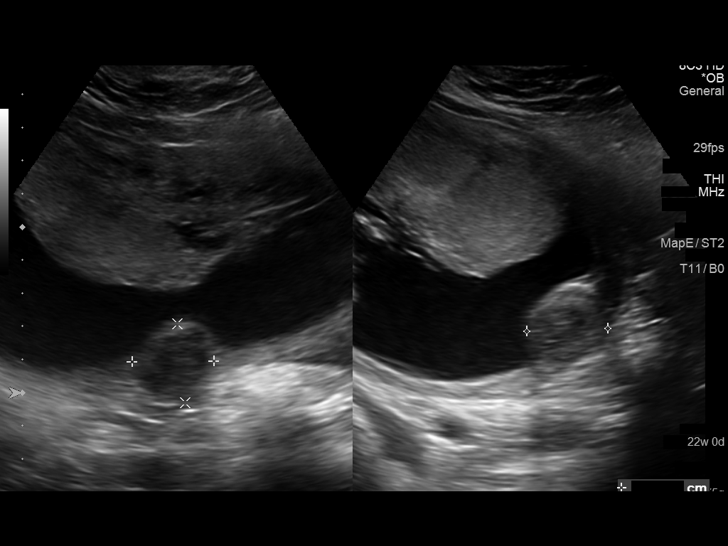
[im 34/36]
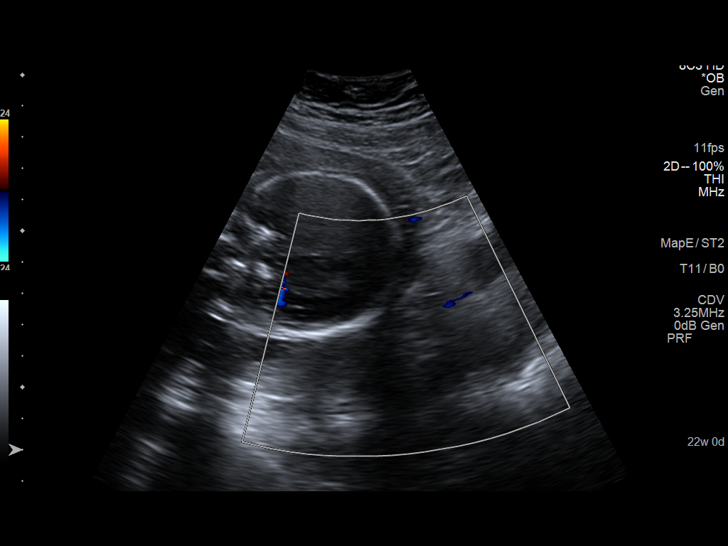

[12 of 28 positions shown; findings below may reference images not displayed]

OBSTETRICS REPORT
                      (Signed Final [DATE] [DATE])

Service(s) Provided

 US OB FOLLOW UP                                       76816.1
Indications

 Follow-up incomplete fetal anatomic evaluation        [UD]
 Cigarette smoker
Fetal Evaluation

 Num Of Fetuses:    1
 Fetal Heart Rate:  149                          bpm
 Cardiac Activity:  Observed
 Presentation:      Cephalic
 Placenta:          Anterior, above cervical os
 P. Cord            Previously Visualized
 Insertion:

 Amniotic Fluid
 AFI FV:      Subjectively upper-normal
                                             Larg Pckt:     8.5  cm
Biometry

 BPD:     50.8  mm     G. Age:  21w 3d                CI:        70.68   70 - 86
                                                      FL/HC:      19.6   18.4 -

 HC:     192.6  mm     G. Age:  21w 4d       20  %    HC/AC:      1.05   1.06 -

 AC:     183.5  mm     G. Age:  23w 1d       78  %    FL/BPD:     74.4   71 - 87
 FL:      37.8  mm     G. Age:  22w 1d       43  %    FL/AC:      20.6   20 - 24

 Est. FW:     510  gm      1 lb 2 oz     55  %
Gestational Age

 LMP:           22w 0d        Date:  [DATE]                 EDD:   [DATE]
 U/S Today:     22w 0d                                        EDD:   [DATE]
 Best:          22w 0d     Det. By:  LMP  ([DATE])          EDD:   [DATE]
Anatomy

 Cranium:          Appears normal         Aortic Arch:      Previously seen
 Fetal Cavum:      Appears normal         Ductal Arch:      Previously seen
 Ventricles:       Appears normal         Diaphragm:        Appears normal
 Choroid Plexus:   Previously seen        Stomach:          Appears normal, left
                                                            sided
 Cerebellum:       Previously seen        Abdomen:          Appears normal
 Posterior Fossa:  Previously seen        Abdominal Wall:   Previously seen
 Nuchal Fold:      Previously seen        Cord Vessels:     Previously seen
 Face:             Orbits and profile     Kidneys:          Appear normal
                   previously seen
 Lips:             Previously seen        Bladder:          Appears normal
 Heart:            Echogenic focus        Spine:            Previously seen
                   in LV
 RVOT:             Appears normal         Lower             Previously seen
                                          Extremities:
 LVOT:             Appears normal         Upper             Previously seen
                                          Extremities:

 Other:  Female gender previously seen. Heels and 5th digit previously
         visualized. Nasal bone visualized
Cervix Uterus Adnexa

 Cervical Length:    3.7      cm

 Cervix:       Normal appearance by transabdominal scan.
 Left Ovary:    Previously seen.
 Right Ovary:   Previously seen
Comments

 An echogenic focus was seen in the left cardiac ventricle.
 This is felt to represent a calcified papillary muscle, and is not
 associated with structural or functional cardiac abnormalities.
 Although an echogenic cardiac focus may be associated with
 an increased risk of Down syndrome, this risk is felt to be
 minimal, especially when it is seen as an isolated finding.
Impression

 Single IUP at 22w 0d
 An echogenic intracardiac focus is noted in the left ventricle
 (see comments)
 Otherwise normal interval anatomy.  The anatomic survey is
 now complete.
 Fetal growth is appropriate (55th%tile)
 Anterior placenta without previa
 Normal amniotic fluid volume
Recommendations

 Follow-up ultrasounds as clinically indicated.

## 2014-07-29 ENCOUNTER — Encounter: Payer: Medicaid Other | Admitting: Obstetrics & Gynecology

## 2014-08-10 ENCOUNTER — Ambulatory Visit (INDEPENDENT_AMBULATORY_CARE_PROVIDER_SITE_OTHER): Payer: Medicaid Other | Admitting: Obstetrics & Gynecology

## 2014-08-10 VITALS — BP 124/80 | HR 105 | Temp 98.3°F | Wt 204.0 lb

## 2014-08-10 DIAGNOSIS — Z3480 Encounter for supervision of other normal pregnancy, unspecified trimester: Secondary | ICD-10-CM

## 2014-08-10 DIAGNOSIS — Z3482 Encounter for supervision of other normal pregnancy, second trimester: Secondary | ICD-10-CM

## 2014-08-10 LAB — POCT URINALYSIS DIPSTICK
Bilirubin, UA: NEGATIVE
GLUCOSE UA: NEGATIVE
Ketones, UA: NEGATIVE
Leukocytes, UA: NEGATIVE
Nitrite, UA: NEGATIVE
PH UA: 7
Protein, UA: NEGATIVE
RBC UA: NEGATIVE
Spec Grav, UA: 1.015
UROBILINOGEN UA: NEGATIVE

## 2014-08-10 NOTE — Patient Instructions (Signed)
Heartburn During Pregnancy  °Heartburn is a burning sensation in the chest caused by stomach acid backing up into the esophagus. Heartburn is common in pregnancy because a certain hormone (progesterone) is released when a woman is pregnant. The progesterone hormone may relax the valve that separates the esophagus from the stomach. This allows acid to go up into the esophagus, causing heartburn. Heartburn may also happen in pregnancy because the enlarging uterus pushes up on the stomach, which pushes more acid into the esophagus. This is especially true in the later stages of pregnancy. Heartburn problems usually go away after giving birth. °CAUSES  °Heartburn is caused by stomach acid backing up into the esophagus. During pregnancy, this may result from various things, including:  °· The progesterone hormone. °· Changing hormone levels. °· The growing uterus pushing stomach acid upward. °· Large meals. °· Certain foods and drinks. °· Exercise. °· Increased acid production. °SIGNS AND SYMPTOMS  °· Burning pain in the chest or lower throat. °· Bitter taste in the mouth. °· Coughing. °DIAGNOSIS  °Your health care provider will typically diagnose heartburn by taking a careful history of your concern. Blood tests may be done to check for a certain type of bacteria that is associated with heartburn. Sometimes, heartburn is diagnosed by prescribing a heartburn medicine to see if the symptoms improve. In some cases, a procedure called an endoscopy may be done. In this procedure, a tube with a light and a camera on the end (endoscope) is used to examine the esophagus and the stomach. °TREATMENT  °Treatment will vary depending on the severity of your symptoms. Your health care provider may recommend: °· Over-the-counter medicines (antacids, acid reducers) for mild heartburn. °· Prescription medicines to decrease stomach acid or to protect your stomach lining. °· Certain changes in your diet. °· Elevating the head of your bed  by putting blocks under the legs. This helps prevent stomach acid from backing up into the esophagus when you are lying down. °HOME CARE INSTRUCTIONS  °· Only take over-the-counter or prescription medicines as directed by your health care provider. °· Raise the head of your bed by putting blocks under the legs if instructed to do so by your health care provider. Sleeping with more pillows is not effective because it only changes the position of your head. °· Do not exercise right after eating. °· Avoid eating 2-3 hours before bed. Do not lie down right after eating. °· Eat small meals throughout the day instead of three large meals. °· Identify foods and beverages that make your symptoms worse and avoid them. Foods you may want to avoid include: °¨ Peppers. °¨ Chocolate. °¨ High-fat foods, including fried foods. °¨ Spicy foods. °¨ Garlic and onions. °¨ Citrus fruits, including oranges, grapefruit, lemons, and limes. °¨ Food containing tomatoes or tomato products. °¨ Mint. °¨ Carbonated and caffeinated drinks. °¨ Vinegar. °SEEK MEDICAL CARE IF: °· You have abdominal pain of any kind. °· You feel burning in your upper abdomen or chest, especially after eating or lying down. °· You have nausea and vomiting. °· Your stomach feels upset after you eat. °SEEK IMMEDIATE MEDICAL CARE IF:  °· You have severe chest pain that goes down your arm or into your jaw or neck. °· You feel sweaty, dizzy, or light-headed. °· You become short of breath. °· You vomit blood. °· You have difficulty or pain with swallowing. °· You have bloody or black, tarry stools. °· You have episodes of heartburn more than 3 times a   week, for more than 2 weeks. °MAKE SURE YOU: °· Understand these instructions. °· Will watch your condition. °· Will get help right away if you are not doing well or get worse. °Document Released: 10/27/2000 Document Revised: 11/04/2013 Document Reviewed: 06/18/2013 °Exit[employer]® Patient Information ©2015 [employer]. This  information is not intended to replace advice given to you by your health care provider. Make sure you discuss any questions you have with your health care provider. ° °

## 2014-08-12 NOTE — Progress Notes (Signed)
Subjective:    Rebecca [name] is a 28 y.o. female being seen today for her obstetrical visit. She is at [redacted]w[redacted]d gestation. Patient reports: no complaints . Fetal movement: normal.  Problem List Items Addressed This Visit     High   Supervision of other normal pregnancy - Primary   Relevant Orders      POCT urinalysis dipstick     Patient Active Problem List   Diagnosis Date Noted  . Supervision of other normal pregnancy 06/17/2014    Priority: High  . Smoker 10/29/2013    Priority: High   Objective:    BP 124/80  Pulse 105  Temp(Src) 98.3 F (36.8 C)  Wt 92.534 kg (204 lb)  LMP 02/18/2014 FHT: 160 BPM  Uterine Size: size equals dates     Assessment:    Pregnancy @ [redacted]w[redacted]d    Plan:    Smoking cessation discussed smokes 4 cigarettes/day.  Labs, problem list reviewed and updated 2 hr GTT planned Follow up in 4 weeks.

## 2014-09-02 ENCOUNTER — Encounter: Payer: Self-pay | Admitting: Obstetrics & Gynecology

## 2014-09-02 ENCOUNTER — Other Ambulatory Visit: Payer: Self-pay

## 2014-09-14 ENCOUNTER — Encounter (HOSPITAL_COMMUNITY): Payer: Self-pay

## 2014-09-24 ENCOUNTER — Encounter: Payer: Medicaid Other | Admitting: Obstetrics

## 2014-10-15 ENCOUNTER — Ambulatory Visit (INDEPENDENT_AMBULATORY_CARE_PROVIDER_SITE_OTHER): Payer: Medicaid Other | Admitting: Obstetrics & Gynecology

## 2014-10-15 VITALS — BP 119/76 | HR 88 | Temp 98.1°F | Wt 205.0 lb

## 2014-10-15 DIAGNOSIS — J029 Acute pharyngitis, unspecified: Secondary | ICD-10-CM

## 2014-10-15 DIAGNOSIS — Z77120 Contact with and (suspected) exposure to mold (toxic): Secondary | ICD-10-CM

## 2014-10-15 DIAGNOSIS — Z3483 Encounter for supervision of other normal pregnancy, third trimester: Secondary | ICD-10-CM

## 2014-10-15 DIAGNOSIS — Z91199 Patient's noncompliance with other medical treatment and regimen due to unspecified reason: Secondary | ICD-10-CM

## 2014-10-15 DIAGNOSIS — O09893 Supervision of other high risk pregnancies, third trimester: Secondary | ICD-10-CM

## 2014-10-15 DIAGNOSIS — O360131 Maternal care for anti-D [Rh] antibodies, third trimester, fetus 1: Secondary | ICD-10-CM

## 2014-10-15 DIAGNOSIS — Z9119 Patient's noncompliance with other medical treatment and regimen: Secondary | ICD-10-CM

## 2014-10-15 LAB — POCT URINALYSIS DIPSTICK
Bilirubin, UA: NEGATIVE
Blood, UA: NEGATIVE
Glucose, UA: NEGATIVE
Ketones, UA: NEGATIVE
LEUKOCYTES UA: NEGATIVE
Nitrite, UA: NEGATIVE
Spec Grav, UA: <=1.005
UROBILINOGEN UA: NEGATIVE
pH, UA: 7

## 2014-10-15 LAB — POCT RAPID STREP A (OFFICE): Rapid Strep A Screen: NEGATIVE

## 2014-10-15 MED ORDER — RHO D IMMUNE GLOBULIN 1500 UNIT/2ML IJ SOSY
300.0000 ug | PREFILLED_SYRINGE | Freq: Once | INTRAMUSCULAR | Status: AC
  Administered 2014-10-15: 300 ug via INTRAMUSCULAR

## 2014-10-16 LAB — MOLD ALLERGENS 1
Alternaria Alternata: <0.1 kU/L
Aspergillus fumigatus, m3: <0.1 kU/L
Cladosporium Herbarum: <0.1 kU/L
Penicillium Notatum: <0.1 kU/L
Stemphylium Botryosum: <0.1 kU/L

## 2014-10-17 NOTE — Progress Notes (Signed)
Subjective:    Rebecca [name] is a 28 y.o. female being seen today for her obstetrical visit. She is at [redacted]w[redacted]d gestation. Patient reports no complaints. Fetal movement: normal. Lapse in care; concerned re: possible mold exposure Problem List Items Addressed This Visit    Supervision of other normal pregnancy - Primary   Relevant Orders      POCT urinalysis dipstick (Completed)    Other Visit Diagnoses    Noncompliant pregnant patient, third trimester        Relevant Orders       Fetal non-stress test    Sore throat        Relevant Orders       POCT rapid strep A (Completed)    Mold suspected exposure        Relevant Orders       Mold Allergens 1 (Completed)    Rh negative state in antepartum period, third trimester, fetus 1        Relevant Medications       rho (d) immune globulin (RHIG/RHOPHYLAC) injection 300 mcg (Completed)      Patient Active Problem List   Diagnosis Date Noted  . Supervision of other normal pregnancy 06/17/2014    Priority: High  . Smoker 10/29/2013    Priority: High   Objective:    BP 119/76 mmHg  Pulse 88  Temp(Src) 98.1 F (36.7 C)  Wt 92.987 kg (205 lb)  LMP 02/18/2014 FHT:  140 BPM  Uterine Size: size equals dates  Presentation: cephalic     Assessment:    Pregnancy @ [redacted]w[redacted]d weeks   Plan:     labs reviewed, problem list updated TDAP offered  Rhogam given for RH negative  Orders Placed This Encounter  Procedures  . Mold Allergens 1  . Fetal non-stress test    Standing Status: Standing     Number of Occurrences: 1     Standing Expiration Date:   . POCT urinalysis dipstick  . POCT rapid strep A    Standing Status: Standing     Number of Occurrences: 1     Standing Expiration Date:    Meds ordered this encounter  Medications  . rho (d) immune globulin (RHIG/RHOPHYLAC) injection 300 mcg    Sig:    Follow up in 2 weeks

## 2014-10-17 NOTE — Patient Instructions (Signed)
Third Trimester of Pregnancy The third trimester is from week 29 through week 42, months 7 through 9. The third trimester is a time when the fetus is growing rapidly. At the end of the ninth month, the fetus is about 20 inches in length and weighs 6-10 pounds.  BODY CHANGES Your body goes through many changes during pregnancy. The changes vary from woman to woman.   Your weight will continue to increase. You can expect to gain 25-35 pounds (11-16 kg) by the end of the pregnancy.  You may begin to get stretch marks on your hips, abdomen, and breasts.  You may urinate more often because the fetus is moving lower into your pelvis and pressing on your bladder.  You may develop or continue to have heartburn as a result of your pregnancy.  You may develop constipation because certain hormones are causing the muscles that push waste through your intestines to slow down.  You may develop hemorrhoids or swollen, bulging veins (varicose veins).  You may have pelvic pain because of the weight gain and pregnancy hormones relaxing your joints between the bones in your pelvis. Backaches may result from overexertion of the muscles supporting your posture.  You may have changes in your hair. These can include thickening of your hair, rapid growth, and changes in texture. Some women also have hair loss during or after pregnancy, or hair that feels dry or thin. Your hair will most likely return to normal after your baby is born.  Your breasts will continue to grow and be tender. A yellow discharge may leak from your breasts called colostrum.  Your belly button may stick out.  You may feel short of breath because of your expanding uterus.  You may notice the fetus "dropping," or moving lower in your abdomen.  You may have a bloody mucus discharge. This usually occurs a few days to a week before labor begins.  Your cervix becomes thin and soft (effaced) near your due date. WHAT TO EXPECT AT YOUR PRENATAL  EXAMS  You will have prenatal exams every 2 weeks until week 36. Then, you will have weekly prenatal exams. During a routine prenatal visit:  You will be weighed to make sure you and the fetus are growing normally.  Your blood pressure is taken.  Your abdomen will be measured to track your baby's growth.  The fetal heartbeat will be listened to.  Any test results from the previous visit will be discussed.  You may have a cervical check near your due date to see if you have effaced. At around 36 weeks, your caregiver will check your cervix. At the same time, your caregiver will also perform a test on the secretions of the vaginal tissue. This test is to determine if a type of bacteria, Group B streptococcus, is present. Your caregiver will explain this further. Your caregiver may ask you:  What your birth plan is.  How you are feeling.  If you are feeling the baby move.  If you have had any abnormal symptoms, such as leaking fluid, bleeding, severe headaches, or abdominal cramping.  If you have any questions. Other tests or screenings that may be performed during your third trimester include:  Blood tests that check for low iron levels (anemia).  Fetal testing to check the health, activity level, and growth of the fetus. Testing is done if you have certain medical conditions or if there are problems during the pregnancy. FALSE LABOR You may feel small, irregular contractions that   eventually go away. These are called Braxton Hicks contractions, or false labor. Contractions may last for hours, days, or even weeks before true labor sets in. If contractions come at regular intervals, intensify, or become painful, it is best to be seen by your caregiver.  SIGNS OF LABOR   Menstrual-like cramps.  Contractions that are 5 minutes apart or less.  Contractions that start on the top of the uterus and spread down to the lower abdomen and back.  A sense of increased pelvic pressure or back  pain.  A watery or bloody mucus discharge that comes from the vagina. If you have any of these signs before the 37th week of pregnancy, call your caregiver right away. You need to go to the hospital to get checked immediately. HOME CARE INSTRUCTIONS   Avoid all smoking, herbs, alcohol, and unprescribed drugs. These chemicals affect the formation and growth of the baby.  Follow your caregiver's instructions regarding medicine use. There are medicines that are either safe or unsafe to take during pregnancy.  Exercise only as directed by your caregiver. Experiencing uterine cramps is a good sign to stop exercising.  Continue to eat regular, healthy meals.  Wear a good support bra for breast tenderness.  Do not use hot tubs, steam rooms, or saunas.  Wear your seat belt at all times when driving.  Avoid raw meat, uncooked cheese, cat litter boxes, and soil used by cats. These carry germs that can cause birth defects in the baby.  Take your prenatal vitamins.  Try taking a stool softener (if your caregiver approves) if you develop constipation. Eat more high-fiber foods, such as fresh vegetables or fruit and whole grains. Drink plenty of fluids to keep your urine clear or pale yellow.  Take warm sitz baths to soothe any pain or discomfort caused by hemorrhoids. Use hemorrhoid cream if your caregiver approves.  If you develop varicose veins, wear support hose. Elevate your feet for 15 minutes, 3-4 times a day. Limit salt in your diet.  Avoid heavy lifting, wear low heal shoes, and practice good posture.  Rest a lot with your legs elevated if you have leg cramps or low back pain.  Visit your dentist if you have not gone during your pregnancy. Use a soft toothbrush to brush your teeth and be gentle when you floss.  A sexual relationship may be continued unless your caregiver directs you otherwise.  Do not travel far distances unless it is absolutely necessary and only with the approval  of your caregiver.  Take prenatal classes to understand, practice, and ask questions about the labor and delivery.  Make a trial run to the hospital.  Pack your hospital bag.  Prepare the baby's nursery.  Continue to go to all your prenatal visits as directed by your caregiver. SEEK MEDICAL CARE IF:  You are unsure if you are in labor or if your water has broken.  You have dizziness.  You have mild pelvic cramps, pelvic pressure, or nagging pain in your abdominal area.  You have persistent nausea, vomiting, or diarrhea.  You have a bad smelling vaginal discharge.  You have pain with urination. SEEK IMMEDIATE MEDICAL CARE IF:   You have a fever.  You are leaking fluid from your vagina.  You have spotting or bleeding from your vagina.  You have severe abdominal cramping or pain.  You have rapid weight loss or gain.  You have shortness of breath with chest pain.  You notice sudden or extreme swelling   of your face, hands, ankles, feet, or legs.  You have not felt your baby move in over an hour.  You have severe headaches that do not go away with medicine.  You have vision changes. Document Released: 10/24/2001 Document Revised: 11/04/2013 Document Reviewed: 12/31/2012 [employer] Patient Information 2015 [employer]. This information is not intended to replace advice given to you by your health care provider. Make sure you discuss any questions you have with your health care provider.  

## 2014-10-27 ENCOUNTER — Telehealth: Payer: Self-pay | Admitting: *Deleted

## 2014-10-27 NOTE — Telephone Encounter (Signed)
FYI:  Dr Delsa Sale wanted follow up on pt living status regarding mold in her home.   Placed call to Conroy with [facility] Dept.  Made her aware of pt situation and asked if she could possibly help with pt.  Pt was lab work to test for mold and results are normal.  Arlene states that they do have environmental surveyors with health dept that may be able to assess her housing conditions.  Arlene has pt demographic info and will update office on pt status.

## 2014-11-02 ENCOUNTER — Encounter: Payer: Medicaid Other | Admitting: Obstetrics & Gynecology

## 2014-11-09 ENCOUNTER — Encounter: Payer: Self-pay | Admitting: *Deleted

## 2014-11-10 ENCOUNTER — Encounter: Payer: Self-pay | Admitting: Obstetrics & Gynecology

## 2014-11-13 NOTE — L&D Delivery Note (Signed)
Delivery Note At 4:43 AM a viable female was delivered via Vaginal, Spontaneous Delivery (Presentation: Left Occiput Anterior).  APGAR: 8-9 , ; weight: 3099 grams  .   Placenta status: Intact, Spontaneous.  Cord: 3 vessels with the following complications: None.  Cord pH: none  Anesthesia: Epidural  Episiotomy: None Lacerations: None Suture Repair: none Est. Blood Loss (mL): 350  Mom to postpartum.  Baby to Couplet care / Skin to Skin.  HARPER,CHARLES A 12/03/2014, 5:26 AM

## 2014-11-18 ENCOUNTER — Ambulatory Visit (INDEPENDENT_AMBULATORY_CARE_PROVIDER_SITE_OTHER): Payer: Medicaid Other | Admitting: Obstetrics

## 2014-11-18 ENCOUNTER — Encounter: Payer: Self-pay | Admitting: Obstetrics

## 2014-11-18 VITALS — BP 118/76 | HR 85 | Temp 97.0°F | Wt 207.0 lb

## 2014-11-18 DIAGNOSIS — Z3483 Encounter for supervision of other normal pregnancy, third trimester: Secondary | ICD-10-CM

## 2014-11-18 LAB — POCT URINALYSIS DIPSTICK
Bilirubin, UA: NEGATIVE
Blood, UA: NEGATIVE
Glucose, UA: NEGATIVE
KETONES UA: NEGATIVE
LEUKOCYTES UA: NEGATIVE
Nitrite, UA: NEGATIVE
PH UA: 7
PROTEIN UA: NEGATIVE
Spec Grav, UA: <=1.005
Urobilinogen, UA: NEGATIVE

## 2014-11-18 NOTE — Progress Notes (Signed)
Subjective:    Rebecca [name] is a 29 y.o. female being seen today for her obstetrical visit. She is at [redacted]w[redacted]d gestation. Patient reports no complaints. Fetal movement: normal.  Problem List Items Addressed This Visit    Supervision of other normal pregnancy - Primary   Relevant Orders      POCT urinalysis dipstick      Strep B DNA probe     Patient Active Problem List   Diagnosis Date Noted  . Supervision of other normal pregnancy 06/17/2014  . Smoker 10/29/2013    Objective:    BP 118/76 mmHg  Pulse 85  Temp(Src) 97 F (36.1 C)  Wt 207 lb (93.895 kg)  LMP 02/18/2014 FHT: 140 BPM  Uterine Size: size equals dates  Presentations: cephalic  Pelvic Exam: Deferred    Assessment:    Pregnancy @ [redacted]w[redacted]d weeks   Plan:   Plans for delivery: Vaginal anticipated; labs reviewed; problem list updated Counseling: Consent signed. Infant feeding: plans to breastfeed. Cigarette smoking: smokes 0.5 PPD. L&D discussion: symptoms of labor, discussed when to call, discussed what number to call, anesthetic/analgesic options reviewed and delivering clinician:  plans Physician. Postpartum supports and preparation: circumcision discussed and contraception plans discussed.  Follow up in 1 Week.

## 2014-11-19 LAB — STREP B DNA PROBE: GBSP: NOT DETECTED

## 2014-11-26 ENCOUNTER — Other Ambulatory Visit: Payer: Self-pay | Admitting: *Deleted

## 2014-11-26 ENCOUNTER — Ambulatory Visit (INDEPENDENT_AMBULATORY_CARE_PROVIDER_SITE_OTHER): Payer: Medicaid Other | Admitting: Obstetrics

## 2014-11-26 VITALS — BP 125/77 | HR 69 | Temp 97.7°F | Wt 206.0 lb

## 2014-11-26 DIAGNOSIS — O480 Post-term pregnancy: Secondary | ICD-10-CM

## 2014-11-26 LAB — POCT URINALYSIS DIPSTICK
BILIRUBIN UA: NEGATIVE
Blood, UA: NEGATIVE
Glucose, UA: NEGATIVE
KETONES UA: NEGATIVE
Leukocytes, UA: NEGATIVE
NITRITE UA: NEGATIVE
Protein, UA: NEGATIVE
Spec Grav, UA: <=1.005
UROBILINOGEN UA: NEGATIVE
pH, UA: 7.5

## 2014-12-01 ENCOUNTER — Encounter: Payer: Self-pay | Admitting: Obstetrics

## 2014-12-01 NOTE — Progress Notes (Signed)
Subjective:    Rebecca [name] is a 29 y.o. female being seen today for her obstetrical visit. She is at [redacted]w[redacted]d gestation. Patient reports no complaints. Fetal movement: normal.  Problem List Items Addressed This Visit    None    Visit Diagnoses    Post-term pregnancy, 40-42 weeks of gestation    -  Primary    Relevant Orders    POCT urinalysis dipstick (Completed)    Fetal non-stress test      Patient Active Problem List   Diagnosis Date Noted  . Supervision of other normal pregnancy 06/17/2014  . Smoker 10/29/2013    Objective:    BP 125/77 mmHg  Pulse 69  Temp(Src) 97.7 F (36.5 C)  Wt 206 lb (93.441 kg)  LMP 02/18/2014 FHT:  140 BPM  Uterine Size: size equals dates  Presentation: cephalic  Pelvic Exam:              Dilation: 2cm       Effacement: 50%   Station:  -3     Consistency: soft            Position: middle     Assessment:    Pregnancy @ [redacted]w[redacted]d  weeks   Plan:    Postdates management: discussed fetal surveillance and induction, discussed fetal movement, NST reactive. IOL scheduled.

## 2014-12-02 ENCOUNTER — Inpatient Hospital Stay (HOSPITAL_COMMUNITY)
Admission: AD | Admit: 2014-12-02 | Discharge: 2014-12-04 | DRG: 775 | Disposition: A | Discharge status: Home / Self Care | Payer: Medicaid Other | Source: Ambulatory Visit | Attending: Obstetrics | Admitting: Obstetrics

## 2014-12-02 ENCOUNTER — Encounter (HOSPITAL_COMMUNITY): Payer: Self-pay | Admitting: *Deleted

## 2014-12-02 DIAGNOSIS — Z3A41 41 weeks gestation of pregnancy: Secondary | ICD-10-CM | POA: Diagnosis present

## 2014-12-02 DIAGNOSIS — F17210 Nicotine dependence, cigarettes, uncomplicated: Secondary | ICD-10-CM | POA: Diagnosis present

## 2014-12-02 DIAGNOSIS — O99334 Smoking (tobacco) complicating childbirth: Secondary | ICD-10-CM | POA: Diagnosis present

## 2014-12-02 DIAGNOSIS — Z3482 Encounter for supervision of other normal pregnancy, second trimester: Secondary | ICD-10-CM

## 2014-12-02 DIAGNOSIS — O480 Post-term pregnancy: Principal | ICD-10-CM | POA: Diagnosis present

## 2014-12-02 LAB — CBC
HCT: 33.4 % — ABNORMAL LOW (ref 36.0–46.0)
HEMOGLOBIN: 10.9 g/dL — AB (ref 12.0–15.0)
MCH: 25.3 pg — AB (ref 26.0–34.0)
MCHC: 32.6 g/dL (ref 30.0–36.0)
MCV: 77.5 fL — ABNORMAL LOW (ref 78.0–100.0)
Platelets: 212 10*3/uL (ref 150–400)
RBC: 4.31 MIL/uL (ref 3.87–5.11)
RDW: 15.3 % (ref 11.5–15.5)
WBC: 8.5 10*3/uL (ref 4.0–10.5)

## 2014-12-02 MED ORDER — EPHEDRINE 5 MG/ML INJ
10.0000 mg | INTRAVENOUS | Status: DC | PRN
  Filled 2014-12-02: qty 2

## 2014-12-02 MED ORDER — DIPHENHYDRAMINE HCL 50 MG/ML IJ SOLN: 12.5000 mg | INTRAMUSCULAR | Status: DC | PRN

## 2014-12-02 MED ORDER — CITRIC ACID-SODIUM CITRATE 334-500 MG/5ML PO SOLN: 30.0000 mL | ORAL | Status: DC | PRN

## 2014-12-02 MED ORDER — OXYCODONE-ACETAMINOPHEN 5-325 MG PO TABS: 1.0000 | ORAL_TABLET | ORAL | Status: DC | PRN

## 2014-12-02 MED ORDER — OXYTOCIN 40 UNITS IN LACTATED RINGERS INFUSION - SIMPLE MED
1.0000 m[IU]/min | INTRAVENOUS | Status: DC
  Administered 2014-12-02: 2 m[IU]/min via INTRAVENOUS
  Filled 2014-12-02: qty 1000

## 2014-12-02 MED ORDER — OXYTOCIN BOLUS FROM INFUSION
500.0000 mL | INTRAVENOUS | Status: DC
  Administered 2014-12-03: 500 mL via INTRAVENOUS

## 2014-12-02 MED ORDER — TERBUTALINE SULFATE 1 MG/ML IJ SOLN: 0.2500 mg | Freq: Once | INTRAMUSCULAR | Status: AC | PRN

## 2014-12-02 MED ORDER — OXYTOCIN 40 UNITS IN LACTATED RINGERS INFUSION - SIMPLE MED: 62.5000 mL/h | INTRAVENOUS | Status: DC

## 2014-12-02 MED ORDER — NALBUPHINE HCL 10 MG/ML IJ SOLN: 10.0000 mg | INTRAMUSCULAR | Status: DC | PRN

## 2014-12-02 MED ORDER — HYDROXYZINE HCL 50 MG PO TABS
50.0000 mg | ORAL_TABLET | Freq: Four times a day (QID) | ORAL | Status: DC | PRN
  Filled 2014-12-02: qty 1

## 2014-12-02 MED ORDER — LACTATED RINGERS IV SOLN
500.0000 mL | INTRAVENOUS | Status: DC | PRN
  Administered 2014-12-03: 500 mL via INTRAVENOUS

## 2014-12-02 MED ORDER — FENTANYL 2.5 MCG/ML BUPIVACAINE 1/10 % EPIDURAL INFUSION (WH - ANES)
14.0000 mL/h | INTRAMUSCULAR | Status: DC | PRN
  Administered 2014-12-03: 14 mL/h via EPIDURAL
  Filled 2014-12-02: qty 125

## 2014-12-02 MED ORDER — LIDOCAINE HCL (PF) 1 % IJ SOLN
30.0000 mL | INTRAMUSCULAR | Status: DC | PRN
  Filled 2014-12-02: qty 30

## 2014-12-02 MED ORDER — OXYCODONE-ACETAMINOPHEN 5-325 MG PO TABS
2.0000 | ORAL_TABLET | ORAL | Status: DC | PRN
Start: 2014-12-02 — End: 2014-12-03

## 2014-12-02 MED ORDER — LACTATED RINGERS IV SOLN
500.0000 mL | Freq: Once | INTRAVENOUS | Status: AC
  Administered 2014-12-03: 500 mL via INTRAVENOUS

## 2014-12-02 MED ORDER — MISOPROSTOL 25 MCG QUARTER TABLET
25.0000 ug | ORAL_TABLET | ORAL | Status: DC
  Filled 2014-12-02: qty 0.25

## 2014-12-02 MED ORDER — PHENYLEPHRINE 40 MCG/ML (10ML) SYRINGE FOR IV PUSH (FOR BLOOD PRESSURE SUPPORT)
80.0000 ug | PREFILLED_SYRINGE | INTRAVENOUS | Status: DC | PRN
  Filled 2014-12-02: qty 20
  Filled 2014-12-02: qty 2

## 2014-12-02 MED ORDER — NALBUPHINE HCL 10 MG/ML IJ SOLN
10.0000 mg | INTRAMUSCULAR | Status: DC | PRN
  Filled 2014-12-02: qty 1

## 2014-12-02 MED ORDER — LACTATED RINGERS IV SOLN
INTRAVENOUS | Status: DC
  Administered 2014-12-02 – 2014-12-03 (×3): via INTRAVENOUS

## 2014-12-02 MED ORDER — MISOPROSTOL 25 MCG QUARTER TABLET: 25.0000 ug | ORAL_TABLET | ORAL | Status: DC | PRN

## 2014-12-02 MED ORDER — ZOLPIDEM TARTRATE 5 MG PO TABS: 5.0000 mg | ORAL_TABLET | Freq: Every day | ORAL | Status: DC

## 2014-12-02 MED ORDER — ONDANSETRON HCL 4 MG/2ML IJ SOLN
4.0000 mg | Freq: Four times a day (QID) | INTRAMUSCULAR | Status: DC | PRN
  Administered 2014-12-02: 4 mg via INTRAVENOUS
  Filled 2014-12-02: qty 2

## 2014-12-02 MED ORDER — ACETAMINOPHEN 325 MG PO TABS
650.0000 mg | ORAL_TABLET | ORAL | Status: DC | PRN
Start: 2014-12-02 — End: 2014-12-03

## 2014-12-02 MED ORDER — NALBUPHINE HCL 10 MG/ML IJ SOLN
10.0000 mg | INTRAMUSCULAR | Status: DC | PRN
  Administered 2014-12-02 (×2): 10 mg via INTRAVENOUS
  Filled 2014-12-02: qty 1

## 2014-12-02 MED ORDER — PHENYLEPHRINE 40 MCG/ML (10ML) SYRINGE FOR IV PUSH (FOR BLOOD PRESSURE SUPPORT)
80.0000 ug | PREFILLED_SYRINGE | INTRAVENOUS | Status: DC | PRN
  Filled 2014-12-02: qty 2

## 2014-12-02 MED ORDER — BUTORPHANOL TARTRATE 1 MG/ML IJ SOLN: 1.0000 mg | INTRAMUSCULAR | Status: DC | PRN

## 2014-12-02 NOTE — MAU Note (Signed)
Pt for induction today, not to be seen in MAU.

## 2014-12-02 NOTE — H&P (Signed)
Rebecca [name] is a 29 y.o. female presenting for presents for IOL for postdates. Maternal Medical History:  Fetal activity: Perceived fetal activity is normal.   Last perceived fetal movement was within the past hour.    Prenatal complications: no prenatal complications Prenatal Complications - Diabetes: none.    OB History    Gravida Para Term Preterm AB TAB SAB Ectopic Multiple Living   7 2 2  0 4 3 1  0 0 2     Past Medical History  Diagnosis Date  . Urinary tract infection   . Abnormal Pap smear   . Infection     trich, chlamydia   Past Surgical History  Procedure Laterality Date  . Induced abortion     Family History: family history includes Diabetes in her father; Hypertension in her mother. There is no history of Other. Social History:  reports that she has been smoking Cigarettes.  She has a 4 pack-year smoking history. She has never used smokeless tobacco. She reports that she does not drink alcohol or use illicit drugs.   Prenatal Transfer Tool  Maternal Diabetes: No Genetic Screening: Normal Maternal Ultrasounds/Referrals: Normal Fetal Ultrasounds or other Referrals:  None Maternal Substance Abuse:  No Significant Maternal Medications:  None Significant Maternal Lab Results:  GBS - Other Comments:  None  Review of Systems  All other systems reviewed and are negative.   Dilation: 2 Effacement (%): 70 Station: -2 Exam by:: soliz rn  Blood pressure 127/77, pulse 79, resp. rate 18, height 5\' 6"  (1.676 m), weight 205 lb (92.987 kg), last menstrual period 02/18/2014. Maternal Exam:  Abdomen: Patient reports no abdominal tenderness. Fetal presentation: vertex  Introitus: Normal vulva. Normal vagina.  Pelvis: adequate for delivery.   Cervix: Cervix evaluated by digital exam.     Physical Exam  Nursing note and vitals reviewed. Constitutional: She is oriented to person, place, and time. She appears well-developed and well-nourished.  HENT:  Head:  Normocephalic and atraumatic.  Eyes: Conjunctivae are normal. Pupils are equal, round, and reactive to light.  Neck: Normal range of motion. Neck supple.  Cardiovascular: Normal rate and regular rhythm.   Respiratory: Effort normal and breath sounds normal.  GI: Soft.  Genitourinary: Vagina normal and uterus normal.  Musculoskeletal: Normal range of motion.  Neurological: She is alert and oriented to person, place, and time.  Skin: Skin is warm and dry.  Psychiatric: She has a normal mood and affect. Her behavior is normal. Judgment and thought content normal.    Prenatal labs: ABO, Rh: O/NEG/-- (08/05 1346) Antibody: NEG (08/05 1346) Rubella: 1.24 (08/05 1346) RPR: NON REAC (08/05 1346)  HBsAg: NEGATIVE (08/05 1346)  HIV: NONREACTIVE (08/05 1346)  GBS: NOT DETECTED (01/06 1142)   Assessment/Plan: 41 weeks.  Pitocin IOL.   Rebecca [name] A 12/02/2014, 12:57 PM

## 2014-12-03 ENCOUNTER — Inpatient Hospital Stay (HOSPITAL_COMMUNITY): Payer: Medicaid Other | Admitting: Anesthesiology

## 2014-12-03 ENCOUNTER — Encounter (HOSPITAL_COMMUNITY): Payer: Self-pay | Admitting: *Deleted

## 2014-12-03 LAB — RPR: RPR Ser Ql: NONREACTIVE

## 2014-12-03 MED ORDER — ONDANSETRON HCL 4 MG PO TABS: 4.0000 mg | ORAL_TABLET | ORAL | Status: DC | PRN

## 2014-12-03 MED ORDER — OXYCODONE-ACETAMINOPHEN 5-325 MG PO TABS: 1.0000 | ORAL_TABLET | ORAL | Status: DC | PRN

## 2014-12-03 MED ORDER — SIMETHICONE 80 MG PO CHEW: 80.0000 mg | CHEWABLE_TABLET | ORAL | Status: DC | PRN

## 2014-12-03 MED ORDER — DIBUCAINE 1 % RE OINT: 1.0000 "application " | TOPICAL_OINTMENT | RECTAL | Status: DC | PRN

## 2014-12-03 MED ORDER — OXYCODONE-ACETAMINOPHEN 5-325 MG PO TABS: 2.0000 | ORAL_TABLET | ORAL | Status: DC | PRN

## 2014-12-03 MED ORDER — LANOLIN HYDROUS EX OINT: TOPICAL_OINTMENT | CUTANEOUS | Status: DC | PRN

## 2014-12-03 MED ORDER — DIPHENHYDRAMINE HCL 25 MG PO CAPS: 25.0000 mg | ORAL_CAPSULE | Freq: Four times a day (QID) | ORAL | Status: DC | PRN

## 2014-12-03 MED ORDER — ZOLPIDEM TARTRATE 5 MG PO TABS: 5.0000 mg | ORAL_TABLET | Freq: Every evening | ORAL | Status: DC | PRN

## 2014-12-03 MED ORDER — WITCH HAZEL-GLYCERIN EX PADS: 1.0000 "application " | MEDICATED_PAD | CUTANEOUS | Status: DC | PRN

## 2014-12-03 MED ORDER — BENZOCAINE-MENTHOL 20-0.5 % EX AERO: 1.0000 "application " | INHALATION_SPRAY | CUTANEOUS | Status: DC | PRN

## 2014-12-03 MED ORDER — ONDANSETRON HCL 4 MG/2ML IJ SOLN
4.0000 mg | INTRAMUSCULAR | Status: DC | PRN
  Administered 2014-12-03: 4 mg via INTRAVENOUS
  Filled 2014-12-03: qty 2

## 2014-12-03 MED ORDER — IBUPROFEN 600 MG PO TABS
600.0000 mg | ORAL_TABLET | Freq: Four times a day (QID) | ORAL | Status: DC
  Administered 2014-12-03 – 2014-12-04 (×4): 600 mg via ORAL
  Filled 2014-12-03 (×4): qty 1

## 2014-12-03 MED ORDER — SENNOSIDES-DOCUSATE SODIUM 8.6-50 MG PO TABS
2.0000 | ORAL_TABLET | ORAL | Status: DC
  Administered 2014-12-03: 2 via ORAL
  Filled 2014-12-03: qty 2

## 2014-12-03 MED ORDER — TETANUS-DIPHTH-ACELL PERTUSSIS 5-2.5-18.5 LF-MCG/0.5 IM SUSP: 0.5000 mL | Freq: Once | INTRAMUSCULAR | Status: DC

## 2014-12-03 MED ORDER — PRENATAL MULTIVITAMIN CH: 1.0000 | ORAL_TABLET | Freq: Every day | ORAL | Status: DC

## 2014-12-03 MED ORDER — PANTOPRAZOLE SODIUM 40 MG PO TBEC
40.0000 mg | DELAYED_RELEASE_TABLET | Freq: Two times a day (BID) | ORAL | Status: DC
  Administered 2014-12-03: 40 mg via ORAL
  Filled 2014-12-03 (×2): qty 1

## 2014-12-03 MED ORDER — OXYTOCIN 40 UNITS IN LACTATED RINGERS INFUSION - SIMPLE MED: 62.5000 mL/h | INTRAVENOUS | Status: DC | PRN

## 2014-12-03 MED ORDER — BUPIVACAINE HCL (PF) 0.25 % IJ SOLN
INTRAMUSCULAR | Status: DC | PRN
  Administered 2014-12-03 (×2): 4 mL via EPIDURAL

## 2014-12-03 MED ORDER — LIDOCAINE-EPINEPHRINE (PF) 2 %-1:200000 IJ SOLN
INTRAMUSCULAR | Status: DC | PRN
  Administered 2014-12-03: 3 mL

## 2014-12-03 NOTE — Progress Notes (Signed)
XIMENA TODARO is a 29 y.o. [id number] at [redacted]w[redacted]d by LMP admitted for induction of labor due to Post dates. Due date 11-25-14.  Subjective:   Objective: BP 113/75 mmHg  Pulse 85  Temp(Src) 97.7 F (36.5 C) (Oral)  Resp 18  Ht 5\' 6"  (1.676 m)  Wt 205 lb (92.987 kg)  BMI 33.10 kg/m2  SpO2 100%  LMP 02/18/2014      FHT:  FHR: 110 bpm, variability: moderate,  accelerations:  Present,  decelerations:  Absent UC:   regular, every 2-3 minutes SVE:   Dilation: 5 Effacement (%): 70 Station: -1 Exam by:: n licato rn  Labs: Lab Results  Component Value Date   WBC 8.5 12/02/2014   HGB 10.9* 12/02/2014   HCT 33.4* 12/02/2014   MCV 77.5* 12/02/2014   PLT 212 12/02/2014    Assessment / Plan: Induction of labor due to postterm,  progressing well on pitocin  Labor: Progressing normally Preeclampsia:  n/a Fetal Wellbeing:  Category I Pain Control:  Nubain I/D:  n/a Anticipated MOD:  NSVD  HARPER,CHARLES A 12/03/2014, 4:32 AM

## 2014-12-03 NOTE — Progress Notes (Signed)
Patient has a boil r lower abd, she reports having this x 2 weeks.

## 2014-12-03 NOTE — Progress Notes (Signed)
CSW acknowledges consult for late/limited prenatal care.  Referral being screened out due to not meeting criteria for CSW intervention.  Late prenatal care is defined as initiating care after 28 days, and MOB was noted to initiate care at 17 weeks.  Limited prenatal care is defined as less than 3 visits, and MOB was noted to have 5 appointments.   Re-consult CSW with specific needs/requests, or upon MOB request.   Lucita Ferrara MSW, [place] Social Worker [phone]

## 2014-12-03 NOTE — Anesthesia Preprocedure Evaluation (Signed)
Anesthesia Evaluation  Patient identified by MRN, date of birth, ID band Patient awake    Reviewed: Allergy & Precautions, NPO status , Patient's Chart, lab work & pertinent test results  History of Anesthesia Complications Negative for: history of anesthetic complications  Airway Mallampati: II  TM Distance: >3 FB Neck ROM: Full    Dental  (+) Teeth Intact   Pulmonary neg shortness of breath, neg sleep apnea, neg COPDneg recent URI, Current Smoker,  breath sounds clear to auscultation        Cardiovascular negative cardio ROS  Rhythm:Regular     Neuro/Psych negative neurological ROS  negative psych ROS   GI/Hepatic negative GI ROS, Neg liver ROS,   Endo/Other  negative endocrine ROS  Renal/GU negative Renal ROS     Musculoskeletal   Abdominal   Peds  Hematology  (+) anemia ,   Anesthesia Other Findings   Reproductive/Obstetrics (+) Pregnancy                             Anesthesia Physical Anesthesia Plan  ASA: II  Anesthesia Plan: Epidural   Post-op Pain Management:    Induction:   Airway Management Planned:   Additional Equipment:   Intra-op Plan:   Post-operative Plan:   Informed Consent: I have reviewed the patients History and Physical, chart, labs and discussed the procedure including the risks, benefits and alternatives for the proposed anesthesia with the patient or authorized representative who has indicated his/her understanding and acceptance.     Plan Discussed with: Anesthesiologist  Anesthesia Plan Comments:         Anesthesia Quick Evaluation

## 2014-12-03 NOTE — Anesthesia Postprocedure Evaluation (Signed)
Anesthesia Post Note  Patient: Rebecca [name]  Procedure(s) Performed: * No procedures listed *  Anesthesia type: Epidural  Patient location: Mother/Baby  Post pain: Pain level controlled  Post assessment: Post-op Vital signs reviewed  Last Vitals:  Filed Vitals:   12/03/14 0645  BP: 138/90  Pulse: 64  Temp: 36.6 C  Resp: 20    Post vital signs: Reviewed  Level of consciousness:alert  Complications: No apparent anesthesia complications

## 2014-12-03 NOTE — Anesthesia Procedure Notes (Signed)
Epidural Patient location during procedure: OB  Staffing Anesthesiologist: Giavanni Odonovan, CHRIS Performed by: anesthesiologist   Preanesthetic Checklist Completed: patient identified, surgical consent, pre-op evaluation, timeout performed, IV checked, risks and benefits discussed and monitors and equipment checked  Epidural Patient position: sitting Prep: site prepped and draped and DuraPrep Patient monitoring: heart rate, cardiac monitor, continuous pulse ox and blood pressure Approach: midline Location: L4-L5 Injection technique: LOR saline  Needle:  Needle type: Tuohy  Needle gauge: 17 G Needle length: 9 cm Needle insertion depth: 6 cm Catheter type: closed end flexible Catheter size: 19 Gauge Catheter at skin depth: 13 cm Test dose: negative and 2% lidocaine with Epi 1:200 K  Assessment Events: blood not aspirated, injection not painful, no injection resistance, negative IV test and no paresthesia  Additional Notes H+P and labs checked, risks and benefits discussed with the patient, consent obtained, procedure tolerated well and without complications.  Reason for block:procedure for pain

## 2014-12-03 NOTE — Progress Notes (Signed)
Ur chart review completed.  

## 2014-12-03 NOTE — Lactation Note (Signed)
This note was copied from the chart of Rebecca [name]. Lactation Consultation Note  Patient Name: Rebecca [name] [id number]'O Date: 12/03/2014 Reason for consult: Initial assessment   Visited with Mom and FOB in room, baby 25 hrs old.  Baby had one 15 minute feeding within an hour after birth, but has been sleeping ever since.  Encouraged skin to skin on Mom's chest.  Talked about manual breast expression.  Reviewed basics of breast feeding.  Offered assistance with positioning and waking.  Mom's 2 older sons in room.  Encouraged her to call for help when baby cues to feed.   Brochure left at bedside.  Informed Mom of IP and OP lactation services available to her.  Encouraged to call prn.  Maternal Data Formula Feeding for Exclusion: No Has patient been taught Hand Expression?: Yes Does the patient have breastfeeding experience prior to this delivery?: Yes    Consult Status Consult Status: PRN Date: 12/04/14 Follow-up type: In-patient    Broadus John 12/03/2014, 4:01 PM

## 2014-12-04 LAB — CBC
HEMATOCRIT: 31.9 % — AB (ref 36.0–46.0)
HEMOGLOBIN: 10.3 g/dL — AB (ref 12.0–15.0)
MCH: 25.3 pg — ABNORMAL LOW (ref 26.0–34.0)
MCHC: 32.3 g/dL (ref 30.0–36.0)
MCV: 78.4 fL (ref 78.0–100.0)
PLATELETS: 167 10*3/uL (ref 150–400)
RBC: 4.07 MIL/uL (ref 3.87–5.11)
RDW: 15.6 % — ABNORMAL HIGH (ref 11.5–15.5)
WBC: 7.9 10*3/uL (ref 4.0–10.5)

## 2014-12-04 MED ORDER — OXYCODONE-ACETAMINOPHEN 5-325 MG PO TABS: 1.0000 | ORAL_TABLET | ORAL | Status: DC | PRN

## 2014-12-04 MED ORDER — IBUPROFEN 600 MG PO TABS: 600.0000 mg | ORAL_TABLET | Freq: Four times a day (QID) | ORAL | Status: DC | PRN

## 2014-12-04 NOTE — Lactation Note (Signed)
This note was copied from the chart of Rebecca [name]. Lactation Consultation Note  Follow up visit done.  Mom states baby just finished feeding.  C/o nipple pain with feeding.  Nipples slightly red and intact.  Comfort gels given with instructions.  Manual pump given with instructions.  Instructed to call for assist when baby starts to cue.  Patient Name: Rebecca [name] HYIFO'Y Date: 12/04/2014     Maternal Data    Feeding Feeding Type: Breast Fed  LATCH Score/Interventions                      Lactation Tools Discussed/Used     Consult Status      Ave Filter 12/04/2014, 8:08 AM

## 2014-12-04 NOTE — Discharge Instructions (Signed)
Before [facility] Ask any questions about feeding, diapering, and baby care before you leave the hospital. Ask again if you do not understand. Ask when you need to see the doctor again. There are several things you must have before your baby comes home.  Infant car seat.  Crib.  Do not let your baby sleep in a bed with you or anyone else.  If you do not have a bed for your baby, ask the doctor what you can use that will be safe for the baby to sleep in. Infant feeding supplies:  6 to 8 bottles (8 ounce size).  6 to 8 nipples.  Measuring cup.  Measuring tablespoon.  Bottle brush.  Sterilizer (or use any large pan or kettle with a lid).  Formula that contains iron.  A way to boil and cool water. Breastfeeding supplies:  Breast pump.  Nipple cream. Clothing:  24 to 36 cloth diapers and waterproof diaper covers or a box of disposable diapers. You may need as many as 10 to 12 diapers per day.  3 onesies (other clothing will depend on the time of year and the weather).  3 receiving blankets.  3 baby pajamas or gowns.  3 bibs. Bath equipment:  Mild soap.  Petroleum jelly. No baby oil or powder.  Soft cloth towel and washcloth.  Cotton balls.  Separate bath basin for baby. Only sponge bathe until umbilical cord and circumcision are healed. Other supplies:  Thermometer and bulb syringe (ask the hospital to send them home with you). Ask your doctor about how you should take your baby's temperature.  One to two pacifiers. Prepare for an emergency:  Know how to get to the hospital and know where to admit your baby.  Put all doctor numbers near your house phone and in your cell phone if you have one. Prepare your family:  Talk with siblings about the baby coming home and how they feel about it.  Decide how you want to handle visitors and other family members.  Take offers for help with the baby. You will need time to adjust. Know when to call the  doctor.  GET HELP RIGHT AWAY IF:  Your baby's temperature is greater than 100.40F (38C).  The soft spot on your baby's head starts to bulge.  Your baby is crying with no tears or has no wet diapers for 6 hours.  Your baby has rapid breathing.  Your baby is not as alert. Document Released: 10/12/2008 Document Revised: 03/16/2014 Document Reviewed: 01/19/2011 [facility] Patient Information 2015 [place], [place]. This information is not intended to replace advice given to you by your health care provider. Make sure you discuss any questions you have with your health care provider.

## 2014-12-04 NOTE — Progress Notes (Signed)
Post Partum Day 1 Subjective: no complaints  Objective: Blood pressure 112/66, pulse 66, temperature 98.4 F (36.9 C), temperature source Oral, resp. rate 18, height 5\' 6"  (1.676 m), weight 205 lb (92.987 kg), last menstrual period 02/18/2014, SpO2 100 %, unknown if currently breastfeeding.  Physical Exam:  General: alert and no distress Lochia: appropriate Uterine Fundus: firm Incision: none DVT Evaluation: No evidence of DVT seen on physical exam.   Recent Labs  12/02/14 1225 12/04/14 0705  HGB 10.9* 10.3*  HCT 33.4* 31.9*    Assessment/Plan: Discharge home   LOS: 2 days   HARPER,CHARLES A 12/04/2014, 8:59 AM

## 2014-12-04 NOTE — Lactation Note (Signed)
This note was copied from the chart of Rebecca Argelia Formisano. Lactation Consultation Note  Mom called out for latch assist prior to discharge.  Assisted with positioning baby in football hold.  Demonstrated to mom how to compress her breast tissue for an easier and deeper latch.  Explained to mom that depth will allow better milk flow and comfort.  Baby latched well with first attempt.  Reminded mom to hold baby close during feeding and use alternate breast massage during the feeding.  Mom states this feeding is more comfortable.  Outpatient lactation services encouraged prn.  Patient Name: Rebecca [name] [id number] Date: 12/04/2014 Reason for consult: Follow-up assessment;Breast/nipple pain   Maternal Data    Feeding Feeding Type: Breast Fed  LATCH Score/Interventions Latch: Grasps breast easily, tongue down, lips flanged, rhythmical sucking.  Audible Swallowing: A few with stimulation Intervention(s): Alternate breast massage  Type of Nipple: Everted at rest and after stimulation  Comfort (Breast/Nipple): Filling, red/small blisters or bruises, mild/mod discomfort  Problem noted: Mild/Moderate discomfort Interventions (Mild/moderate discomfort): Comfort gels  Hold (Positioning): Assistance needed to correctly position infant at breast and maintain latch.  LATCH Score: 7  Lactation Tools Discussed/Used     Consult Status Consult Status: Complete    Ave Filter 12/04/2014, 10:33 AM

## 2014-12-04 NOTE — Discharge Summary (Signed)
Obstetric Discharge Summary Reason for Admission: induction of labor Prenatal Procedures: ultrasound, NST Intrapartum Procedures: spontaneous vaginal delivery Postpartum Procedures: none Complications-Operative and Postpartum: none HEMOGLOBIN  Date Value Ref Range Status  12/04/2014 10.3* 12.0 - 15.0 g/dL Final   HCT  Date Value Ref Range Status  12/04/2014 31.9* 36.0 - 46.0 % Final    Physical Exam:  General: alert and no distress Lochia: appropriate Uterine Fundus: firm Incision: none DVT Evaluation: No evidence of DVT seen on physical exam.  Discharge Diagnoses: Term Pregnancy-delivered  Discharge Information: Date: 12/04/2014 Activity: pelvic rest Diet: routine Medications: PNV, Ibuprofen, Colace and Percocet Condition: stable Instructions: refer to practice specific booklet Discharge to: home Follow-up Information    Follow up with Yehudit Fulginiti A, MD In 2 weeks.   Specialty:  Obstetrics and Gynecology   Contact information:   [address] [place] [address] [phone]       Newborn Data: Live born female  Birth Weight: 6 lb 13.3 oz (3099 g) APGAR: 8, 9  Home with mother.  Tyisha Cressy A 12/04/2014, 9:03 AM

## 2014-12-07 LAB — TYPE AND SCREEN
ABO/RH(D): O NEG
Antibody Screen: POSITIVE
DAT, IGG: NEGATIVE
Unit division: 0
Unit division: 0

## 2014-12-11 ENCOUNTER — Emergency Department (HOSPITAL_COMMUNITY): Payer: Medicaid Other

## 2014-12-11 ENCOUNTER — Encounter (HOSPITAL_COMMUNITY): Payer: Self-pay | Admitting: Emergency Medicine

## 2014-12-11 ENCOUNTER — Observation Stay (HOSPITAL_COMMUNITY)
Admission: EM | Admit: 2014-12-11 | Discharge: 2014-12-14 | Disposition: A | Discharge status: Home / Self Care | Payer: Medicaid Other | Attending: Orthopedic Surgery | Admitting: Orthopedic Surgery

## 2014-12-11 DIAGNOSIS — S52502A Unspecified fracture of the lower end of left radius, initial encounter for closed fracture: Secondary | ICD-10-CM | POA: Diagnosis present

## 2014-12-11 DIAGNOSIS — Y998 Other external cause status: Secondary | ICD-10-CM | POA: Insufficient documentation

## 2014-12-11 DIAGNOSIS — Y9389 Activity, other specified: Secondary | ICD-10-CM | POA: Diagnosis not present

## 2014-12-11 DIAGNOSIS — S52592A Other fractures of lower end of left radius, initial encounter for closed fracture: Principal | ICD-10-CM | POA: Insufficient documentation

## 2014-12-11 DIAGNOSIS — Y9289 Other specified places as the place of occurrence of the external cause: Secondary | ICD-10-CM | POA: Insufficient documentation

## 2014-12-11 DIAGNOSIS — F17210 Nicotine dependence, cigarettes, uncomplicated: Secondary | ICD-10-CM | POA: Diagnosis not present

## 2014-12-11 DIAGNOSIS — S52612A Displaced fracture of left ulna styloid process, initial encounter for closed fracture: Secondary | ICD-10-CM

## 2014-12-11 DIAGNOSIS — S52509A Unspecified fracture of the lower end of unspecified radius, initial encounter for closed fracture: Secondary | ICD-10-CM | POA: Diagnosis present

## 2014-12-11 DIAGNOSIS — W002XXA Other fall from one level to another due to ice and snow, initial encounter: Secondary | ICD-10-CM | POA: Insufficient documentation

## 2014-12-11 LAB — BASIC METABOLIC PANEL
ANION GAP: 11 (ref 5–15)
BUN: 9 mg/dL (ref 6–23)
CHLORIDE: 105 mmol/L (ref 96–112)
CO2: 22 mmol/L (ref 19–32)
CREATININE: 0.73 mg/dL (ref 0.50–1.10)
Calcium: 10 mg/dL (ref 8.4–10.5)
GFR calc non Af Amer: >90 mL/min (ref >90)
GLUCOSE: 78 mg/dL (ref 70–99)
POTASSIUM: 4 mmol/L (ref 3.5–5.1)
SODIUM: 138 mmol/L (ref 135–145)

## 2014-12-11 LAB — CBC WITH DIFFERENTIAL/PLATELET
Basophils Absolute: 0 10*3/uL (ref 0.0–0.1)
Basophils Relative: 0 % (ref 0–1)
EOS ABS: 0.1 10*3/uL (ref 0.0–0.7)
EOS PCT: 1 % (ref 0–5)
HEMATOCRIT: 39 % (ref 36.0–46.0)
Hemoglobin: 12.9 g/dL (ref 12.0–15.0)
LYMPHS ABS: 2.3 10*3/uL (ref 0.7–4.0)
Lymphocytes Relative: 35 % (ref 12–46)
MCH: 25.8 pg — AB (ref 26.0–34.0)
MCHC: 33.1 g/dL (ref 30.0–36.0)
MCV: 78 fL (ref 78.0–100.0)
Monocytes Absolute: 0.5 10*3/uL (ref 0.1–1.0)
Monocytes Relative: 8 % (ref 3–12)
NEUTROS PCT: 56 % (ref 43–77)
Neutro Abs: 3.5 10*3/uL (ref 1.7–7.7)
Platelets: 257 10*3/uL (ref 150–400)
RBC: 5 MIL/uL (ref 3.87–5.11)
RDW: 15.8 % — ABNORMAL HIGH (ref 11.5–15.5)
WBC: 6.5 10*3/uL (ref 4.0–10.5)

## 2014-12-11 MED ORDER — HYDROMORPHONE HCL 1 MG/ML IJ SOLN
1.0000 mg | Freq: Once | INTRAMUSCULAR | Status: AC
  Administered 2014-12-11: 1 mg via INTRAVENOUS
  Filled 2014-12-11: qty 1

## 2014-12-11 MED ORDER — OXYCODONE HCL 5 MG PO TABS
5.0000 mg | ORAL_TABLET | ORAL | Status: DC | PRN
  Administered 2014-12-11 – 2014-12-14 (×6): 10 mg via ORAL
  Filled 2014-12-11 (×6): qty 2

## 2014-12-11 MED ORDER — LACTATED RINGERS IV SOLN
INTRAVENOUS | Status: DC
  Administered 2014-12-11: 17:00:00 via INTRAVENOUS

## 2014-12-11 MED ORDER — ONDANSETRON HCL 4 MG/2ML IJ SOLN
4.0000 mg | Freq: Four times a day (QID) | INTRAMUSCULAR | Status: DC | PRN
  Administered 2014-12-12: 4 mg via INTRAVENOUS
  Filled 2014-12-11: qty 2

## 2014-12-11 MED ORDER — ALPRAZOLAM 0.5 MG PO TABS
0.5000 mg | ORAL_TABLET | Freq: Four times a day (QID) | ORAL | Status: DC | PRN
  Administered 2014-12-11 – 2014-12-13 (×3): 0.5 mg via ORAL
  Filled 2014-12-11 (×3): qty 1

## 2014-12-11 MED ORDER — METHOCARBAMOL 1000 MG/10ML IJ SOLN
500.0000 mg | Freq: Four times a day (QID) | INTRAMUSCULAR | Status: DC | PRN
  Filled 2014-12-11: qty 5

## 2014-12-11 MED ORDER — METHOCARBAMOL 500 MG PO TABS
500.0000 mg | ORAL_TABLET | Freq: Four times a day (QID) | ORAL | Status: DC | PRN
  Administered 2014-12-11 – 2014-12-13 (×2): 500 mg via ORAL
  Filled 2014-12-11 (×3): qty 1

## 2014-12-11 MED ORDER — DOCUSATE SODIUM 100 MG PO CAPS
100.0000 mg | ORAL_CAPSULE | Freq: Two times a day (BID) | ORAL | Status: DC
  Administered 2014-12-11 – 2014-12-14 (×6): 100 mg via ORAL
  Filled 2014-12-11 (×7): qty 1

## 2014-12-11 MED ORDER — FAMOTIDINE 20 MG PO TABS
20.0000 mg | ORAL_TABLET | Freq: Two times a day (BID) | ORAL | Status: DC | PRN
  Filled 2014-12-11: qty 1

## 2014-12-11 MED ORDER — VITAMIN C 500 MG PO TABS
1000.0000 mg | ORAL_TABLET | Freq: Every day | ORAL | Status: DC
  Administered 2014-12-11 – 2014-12-14 (×3): 1000 mg via ORAL
  Filled 2014-12-11 (×4): qty 2

## 2014-12-11 MED ORDER — ONDANSETRON HCL 4 MG/2ML IJ SOLN
4.0000 mg | Freq: Once | INTRAMUSCULAR | Status: AC
  Administered 2014-12-11: 4 mg via INTRAVENOUS
  Filled 2014-12-11: qty 2

## 2014-12-11 MED ORDER — HYDROMORPHONE HCL 1 MG/ML IJ SOLN
0.5000 mg | INTRAMUSCULAR | Status: DC | PRN
  Administered 2014-12-11 – 2014-12-13 (×14): 1 mg via INTRAVENOUS
  Filled 2014-12-11 (×15): qty 1

## 2014-12-11 MED ORDER — LIDOCAINE HCL 2 % IJ SOLN
20.0000 mL | Freq: Once | INTRAMUSCULAR | Status: AC
  Administered 2014-12-11: 400 mg via INTRADERMAL
  Filled 2014-12-11: qty 20

## 2014-12-11 MED ORDER — ONDANSETRON HCL 4 MG PO TABS
4.0000 mg | ORAL_TABLET | Freq: Four times a day (QID) | ORAL | Status: DC | PRN
  Administered 2014-12-14: 4 mg via ORAL
  Filled 2014-12-11: qty 1

## 2014-12-11 MED ORDER — FENTANYL CITRATE 0.05 MG/ML IJ SOLN: 50.0000 ug | Freq: Once | INTRAMUSCULAR | Status: DC

## 2014-12-11 MED ORDER — PROMETHAZINE HCL 25 MG RE SUPP
12.5000 mg | Freq: Four times a day (QID) | RECTAL | Status: DC | PRN
  Filled 2014-12-11: qty 1

## 2014-12-11 NOTE — ED Notes (Addendum)
Pt from home with c/o slipping on ice in a parking lot this am falling on her left wrist, <3 sec cap refill time.  Deformity noted to left wrist, patient tearful, A&O.

## 2014-12-11 NOTE — Progress Notes (Signed)
Orthopedic Tech Progress Note Patient Details:  Rebecca [name] 04-Aug-1986 [id number]  Casting Cast Location: lue Cast Material: Fiberglass Cast Intervention: Application     Hildred Priest 12/11/2014, 2:49 PM

## 2014-12-11 NOTE — ED Notes (Signed)
Pt taken directly back to exam room from triage. Pt cussing at staff members. Charge RN aware.

## 2014-12-11 NOTE — ED Notes (Signed)
Dr Amedeo Plenty did left wrist nerve block.  Pt pain decreased from 8 to 3/10.

## 2014-12-11 NOTE — ED Provider Notes (Signed)
CSN: [id number]     Arrival date & time 12/11/14  0940 History   First MD Initiated Contact with Patient 12/11/14 [phone]     Chief Complaint  Patient presents with  . Wrist Injury     (Consider location/radiation/quality/duration/timing/severity/associated sxs/prior Treatment) HPI Comments: Patient presents with complaint of left wrist pain and deformity after falling on an outstretched hand this morning. Injury occurred just prior to arrival. No treatments prior to arrival. Patient reports decreased sensation. Onset acute. Course constant. Last oral intake was yesterday. Patient delivered a child approximately one week ago.  The history is provided by the patient.    Past Medical History  Diagnosis Date  . Urinary tract infection   . Abnormal Pap smear   . Infection     trich, chlamydia   Past Surgical History  Procedure Laterality Date  . Induced abortion     Family History  Problem Relation Age of Onset  . Hypertension Mother   . Diabetes Father   . Other Neg Hx    History  Substance Use Topics  . Smoking status: Current Every Day Smoker -- 0.50 packs/day for 8 years    Types: Cigarettes  . Smokeless tobacco: Never Used  . Alcohol Use: No     Comment: socially   OB History    Gravida Para Term Preterm AB TAB SAB Ectopic Multiple Living   7 3 3  0 4 3 1  0 0 3     Review of Systems  Constitutional: Negative for fever and activity change.  HENT: Negative for rhinorrhea and sore throat.   Eyes: Negative for redness.  Respiratory: Negative for cough.   Cardiovascular: Negative for chest pain.  Gastrointestinal: Negative for nausea, vomiting, abdominal pain and diarrhea.  Genitourinary: Negative for dysuria.  Musculoskeletal: Positive for myalgias, joint swelling and arthralgias. Negative for back pain and neck pain.  Skin: Negative for rash and wound.  Neurological: Negative for weakness, numbness and headaches.   Allergies  Review of patient's allergies indicates  no known allergies.  Home Medications   Prior to Admission medications   Medication Sig Start Date End Date Taking? Authorizing Provider  ibuprofen (ADVIL,MOTRIN) 600 MG tablet Take 1 tablet (600 mg total) by mouth every 6 (six) hours as needed for mild pain. 12/04/14   Shelly Bombard, MD  oxyCODONE-acetaminophen (PERCOCET/ROXICET) 5-325 MG per tablet Take 1-2 tablets by mouth every 4 (four) hours as needed for moderate pain or severe pain (for pain scale equal to or greater than 7). 12/04/14   Shelly Bombard, MD  ranitidine (ZANTAC) 150 MG tablet Take 150 mg by mouth 3 (three) times daily as needed for heartburn.    Historical Provider, MD   BP 158/83 mmHg  Pulse 95  Temp(Src) 98.1 F (36.7 C) (Oral)  Resp 18  Wt 205 lb (92.987 kg)  SpO2 98%   Physical Exam  Constitutional: She appears well-developed and well-nourished.  HENT:  Head: Normocephalic and atraumatic.  Eyes: Pupils are equal, round, and reactive to light.  Neck: Normal range of motion. Neck supple.  Cardiovascular: Exam reveals no decreased pulses.   Musculoskeletal: She exhibits tenderness. She exhibits no edema.       Left shoulder: Normal.       Left elbow: Normal.       Left wrist: She exhibits decreased range of motion, tenderness, bony tenderness and deformity.       Left hand: She exhibits normal range of motion and no tenderness. Decreased sensation  noted. Normal strength noted.  Brisk capillary refill, fingers of L hand.   Neurological: She is alert. No sensory deficit.  Motor, sensation, and vascular distal to the injury is fully intact.   Skin: Skin is warm and dry.  Psychiatric: She has a normal mood and affect.  Nursing note and vitals reviewed.   ED Course  Procedures (including critical care time) Labs Review Labs Reviewed  CBC WITH DIFFERENTIAL/PLATELET  BASIC METABOLIC PANEL    Imaging Review Dg Wrist Complete Left  12/11/2014   CLINICAL DATA:  [place] on ice and injured left wrist today.   EXAM: LEFT WRIST - COMPLETE 3+ VIEW  COMPARISON:  None.  FINDINGS: Severely comminuted and displaced intra-articular fracture of the distal radius with dorsal impaction. There is also an ulnar styloid avulsion fracture.  IMPRESSION: Severely comminuted and displaced intra-articular fracture of the distal radius.  Ulnar styloid avulsion fracture.   Electronically Signed   By: Kalman Jewels M.D.   On: 12/11/2014 10:58     EKG Interpretation None       9:56 AM Patient seen and examined. Obvious wrist deformity. I cannot find radial pulse as patient cannot tolerate anything touching her wrist. She has brisk capillary refill of fingers of left hand. Skin is normal color and temperature.    Vital signs reviewed and are as follows: BP 158/83 mmHg  Pulse 95  Temp(Src) 98.1 F (36.7 C) (Oral)  Resp 18  Wt 205 lb (92.987 kg)  SpO2 98%  2:37 PM Reduction performed by Dr. Amedeo Plenty in ED. Will admit. Surgical fixation planned for tomorrow.   MDM   Final diagnoses:  Distal radius fracture, left, closed, initial encounter  Fracture of ulnar styloid, left, closed, initial encounter   Admit.     Carlisle Cater, PA-C 12/11/14 [place], DO 12/11/14 [phone]

## 2014-12-11 NOTE — ED Notes (Signed)
Patient transported to X-ray 

## 2014-12-11 NOTE — H&P (Signed)
Rebecca [name] is an 29 y.o. female.   Chief Complaint: left radius fracture after fall on the ice HPI: Patient presents for evaluation and treatment of the of their upper extremity predicament. The patient denies neck back chest or of abdominal pain. The patient notes that they have no lower extremity problems. The patient from primarily complains of the upper extremity pain noted. Past Medical History  Diagnosis Date  . Urinary tract infection   . Abnormal Pap smear   . Infection     trich, chlamydia    Past Surgical History  Procedure Laterality Date  . Induced abortion      Family History  Problem Relation Age of Onset  . Hypertension Mother   . Diabetes Father   . Other Neg Hx    Social History:  reports that she has been smoking Cigarettes.  She has a 4 pack-year smoking history. She has never used smokeless tobacco. She reports that she does not drink alcohol or use illicit drugs.  Allergies: No Known Allergies   (Not in a hospital admission)  No results found for this or any previous visit (from the past 48 hour(s)). Dg Wrist Complete Left  12/11/2014   CLINICAL DATA:  [place] on ice and injured left wrist today.  EXAM: LEFT WRIST - COMPLETE 3+ VIEW  COMPARISON:  None.  FINDINGS: Severely comminuted and displaced intra-articular fracture of the distal radius with dorsal impaction. There is also an ulnar styloid avulsion fracture.  IMPRESSION: Severely comminuted and displaced intra-articular fracture of the distal radius.  Ulnar styloid avulsion fracture.   Electronically Signed   By: Kalman Jewels M.D.   On: 12/11/2014 10:58    Review of Systems  HENT: Negative.   Respiratory: Negative.   Cardiovascular: Negative.   Genitourinary: Negative.   Neurological: Negative.   Psychiatric/Behavioral: Negative.     Blood pressure 152/95, pulse 37, temperature 98.1 F (36.7 C), temperature source Oral, resp. rate 18, weight 92.987 kg (205 lb), SpO2 98 %, currently  breastfeeding. Physical Exam  The patient is alert and oriented in no acute distress the patient complains of pain in the affected upper extremity.  The patient is noted to have a normal HEENT exam.  Lung fields show equal chest expansion and no shortness of breath  abdomen exam is nontender without distention.  Lower extremity examination does not show any fracture dislocation or blood clot symptoms.  Pelvis is stable neck and back are stable and nontender   Assessment/Plan Left comminuted greater than 3 part distal radius fracture with severe displacement  Patient was consented verbally and underwent a hematoma block.  Procedure note hematoma block was performed with 25 mL of lidocaine without epinephrine. Following this the arm is prepped and draped a second time followed by manipulative reduction. Once reduction was accomplished we performed AP lateral and oblique imaging performed examined and interrupted by myself which showed much improved position. Sugar tong splint apllied and 3 point mold set  Plan for ice elevation and definitive fixation in the OR tomorrow  Patient aware and desires to proceed-all questions answered  We are planning surgery for your upper extremity. The risk and benefits of surgery include risk of bleeding infection anesthesia damage to normal structures and failure of the surgery to accomplish its intended goals of relieving symptoms and restoring function with this in mind we'll going to proceed. I have specifically discussed with the patient the pre-and postoperative regime and the does and don'ts and risk and benefits  in great detail. Risk and benefits of surgery also include risk of dystrophy chronic nerve pain failure of the healing process to go onto completion and other inherent risks of surgery The relavent the pathophysiology of the disease/injury process, as well as the alternatives for treatment and postoperative course of action has been discussed in  great detail with the patient who desires to proceed.  We will do everything in our power to help you (the patient) restore function to the upper extremity. Is a pleasure to see this patient today.    Paulene Floor 12/11/2014, 2:20 PM

## 2014-12-12 ENCOUNTER — Encounter (HOSPITAL_COMMUNITY)
Admission: EM | Disposition: A | Discharge status: Home / Self Care | Payer: Self-pay | Source: Home / Self Care | Attending: Emergency Medicine

## 2014-12-12 ENCOUNTER — Observation Stay (HOSPITAL_COMMUNITY): Payer: Medicaid Other | Admitting: Certified Registered Nurse Anesthetist

## 2014-12-12 ENCOUNTER — Encounter (HOSPITAL_COMMUNITY): Payer: Self-pay | Admitting: Certified Registered Nurse Anesthetist

## 2014-12-12 DIAGNOSIS — S52592A Other fractures of lower end of left radius, initial encounter for closed fracture: Secondary | ICD-10-CM | POA: Diagnosis not present

## 2014-12-12 DIAGNOSIS — F17210 Nicotine dependence, cigarettes, uncomplicated: Secondary | ICD-10-CM | POA: Diagnosis not present

## 2014-12-12 HISTORY — PX: OPEN REDUCTION INTERNAL FIXATION (ORIF) DISTAL RADIAL FRACTURE: SHX5989

## 2014-12-12 SURGERY — OPEN REDUCTION INTERNAL FIXATION (ORIF) DISTAL RADIUS FRACTURE
Anesthesia: General | Site: Hand | Laterality: Left

## 2014-12-12 MED ORDER — PROPOFOL 10 MG/ML IV BOLUS
INTRAVENOUS | Status: DC | PRN
  Administered 2014-12-12: 200 mg via INTRAVENOUS

## 2014-12-12 MED ORDER — CEFAZOLIN SODIUM-DEXTROSE 2-3 GM-% IV SOLR
INTRAVENOUS | Status: DC | PRN
  Administered 2014-12-12: 2 g via INTRAVENOUS

## 2014-12-12 MED ORDER — LIDOCAINE HCL (CARDIAC) 20 MG/ML IV SOLN
INTRAVENOUS | Status: AC
  Filled 2014-12-12: qty 5

## 2014-12-12 MED ORDER — FENTANYL CITRATE 0.05 MG/ML IJ SOLN
INTRAMUSCULAR | Status: AC
  Filled 2014-12-12: qty 5

## 2014-12-12 MED ORDER — MIDAZOLAM HCL 5 MG/5ML IJ SOLN
INTRAMUSCULAR | Status: DC | PRN
  Administered 2014-12-12: 2 mg via INTRAVENOUS

## 2014-12-12 MED ORDER — MIDAZOLAM HCL 2 MG/2ML IJ SOLN
INTRAMUSCULAR | Status: AC
  Filled 2014-12-12: qty 2

## 2014-12-12 MED ORDER — PROPOFOL 10 MG/ML IV BOLUS
INTRAVENOUS | Status: AC
Start: 2014-12-12 — End: 2014-12-12
  Filled 2014-12-12: qty 20

## 2014-12-12 MED ORDER — IBUPROFEN 200 MG PO TABS
600.0000 mg | ORAL_TABLET | Freq: Four times a day (QID) | ORAL | Status: DC | PRN
  Administered 2014-12-12 – 2014-12-14 (×5): 600 mg via ORAL
  Filled 2014-12-12 (×6): qty 3

## 2014-12-12 MED ORDER — FENTANYL CITRATE 0.05 MG/ML IJ SOLN
INTRAMUSCULAR | Status: DC | PRN
  Administered 2014-12-12 (×2): 50 ug via INTRAVENOUS

## 2014-12-12 MED ORDER — BUPIVACAINE-EPINEPHRINE (PF) 0.5% -1:200000 IJ SOLN
INTRAMUSCULAR | Status: DC | PRN
  Administered 2014-12-12: 30 mL via PERINEURAL

## 2014-12-12 MED ORDER — ONDANSETRON HCL 4 MG/2ML IJ SOLN
INTRAMUSCULAR | Status: AC
Start: 2014-12-12 — End: 2014-12-12
  Filled 2014-12-12: qty 2

## 2014-12-12 MED ORDER — HYDROMORPHONE HCL 1 MG/ML IJ SOLN: 0.2500 mg | INTRAMUSCULAR | Status: DC | PRN

## 2014-12-12 MED ORDER — EPHEDRINE SULFATE 50 MG/ML IJ SOLN
INTRAMUSCULAR | Status: AC
  Filled 2014-12-12: qty 1

## 2014-12-12 MED ORDER — LACTATED RINGERS IV SOLN
INTRAVENOUS | Status: DC | PRN
  Administered 2014-12-12: 14:00:00 via INTRAVENOUS

## 2014-12-12 MED ORDER — SODIUM CHLORIDE 0.9 % IJ SOLN
INTRAMUSCULAR | Status: AC
  Filled 2014-12-12: qty 20

## 2014-12-12 MED ORDER — PROMETHAZINE HCL 25 MG/ML IJ SOLN: 6.2500 mg | INTRAMUSCULAR | Status: DC | PRN

## 2014-12-12 MED ORDER — ROCURONIUM BROMIDE 50 MG/5ML IV SOLN
INTRAVENOUS | Status: AC
  Filled 2014-12-12: qty 1

## 2014-12-12 MED ORDER — BUPIVACAINE-EPINEPHRINE (PF) 0.25% -1:200000 IJ SOLN
INTRAMUSCULAR | Status: AC
  Filled 2014-12-12: qty 30

## 2014-12-12 SURGICAL SUPPLY — 58 items
BANDAGE ELASTIC 3 VELCRO ST LF (GAUZE/BANDAGES/DRESSINGS) ×3 IMPLANT
BANDAGE ELASTIC 4 VELCRO ST LF (GAUZE/BANDAGES/DRESSINGS) ×3 IMPLANT
BIT DRILL 2.2 SS TIBIAL (BIT) ×3 IMPLANT
BLADE SURG ROTATE 9660 (MISCELLANEOUS) IMPLANT
BNDG ESMARK 4X9 LF (GAUZE/BANDAGES/DRESSINGS) ×3 IMPLANT
BNDG GAUZE ELAST 4 BULKY (GAUZE/BANDAGES/DRESSINGS) ×3 IMPLANT
CORDS BIPOLAR (ELECTRODE) ×3 IMPLANT
COVER SURGICAL LIGHT HANDLE (MISCELLANEOUS) ×3 IMPLANT
CUFF TOURNIQUET SINGLE 18IN (TOURNIQUET CUFF) ×3 IMPLANT
CUFF TOURNIQUET SINGLE 24IN (TOURNIQUET CUFF) IMPLANT
DECANTER SPIKE VIAL GLASS SM (MISCELLANEOUS) IMPLANT
DRAIN TLS ROUND 10FR (DRAIN) IMPLANT
DRAPE OEC MINIVIEW 54X84 (DRAPES) IMPLANT
DRAPE U-SHAPE 47X51 STRL (DRAPES) ×3 IMPLANT
GAUZE SPONGE 4X4 12PLY STRL (GAUZE/BANDAGES/DRESSINGS) ×3 IMPLANT
GAUZE XEROFORM 1X8 LF (GAUZE/BANDAGES/DRESSINGS) ×3 IMPLANT
GLOVE ORTHO TXT STRL SZ7.5 (GLOVE) ×3 IMPLANT
GLOVE SS BIOGEL STRL SZ 8 (GLOVE) ×1 IMPLANT
GLOVE SUPERSENSE BIOGEL SZ 8 (GLOVE) ×2
GOWN STRL REUS W/ TWL LRG LVL3 (GOWN DISPOSABLE) ×3 IMPLANT
GOWN STRL REUS W/ TWL XL LVL3 (GOWN DISPOSABLE) ×3 IMPLANT
GOWN STRL REUS W/TWL LRG LVL3 (GOWN DISPOSABLE) ×6
GOWN STRL REUS W/TWL XL LVL3 (GOWN DISPOSABLE) ×6
KIT BASIN OR (CUSTOM PROCEDURE TRAY) ×3 IMPLANT
KIT ROOM TURNOVER OR (KITS) ×3 IMPLANT
LOOP VESSEL MAXI BLUE (MISCELLANEOUS) IMPLANT
MANIFOLD NEPTUNE II (INSTRUMENTS) ×3 IMPLANT
NEEDLE 22X1 1/2 (OR ONLY) (NEEDLE) IMPLANT
NS IRRIG 1000ML POUR BTL (IV SOLUTION) ×3 IMPLANT
PACK ORTHO EXTREMITY (CUSTOM PROCEDURE TRAY) ×3 IMPLANT
PAD ARMBOARD 7.5X6 YLW CONV (MISCELLANEOUS) ×6 IMPLANT
PAD CAST 4YDX4 CTTN HI CHSV (CAST SUPPLIES) ×1 IMPLANT
PADDING CAST ABS 3INX4YD NS (CAST SUPPLIES) ×2
PADDING CAST ABS COTTON 3X4 (CAST SUPPLIES) ×1 IMPLANT
PADDING CAST COTTON 4X4 STRL (CAST SUPPLIES) ×2
PEG LOCKING SMOOTH 2.2X18 (Peg) ×9 IMPLANT
PEG LOCKING SMOOTH 2.2X20 (Screw) ×9 IMPLANT
PLATE STANDARD DVR LEFT (Plate) ×3 IMPLANT
PLATE STD DVR LT 24X51 (Plate) ×1 IMPLANT
SCREW LOCK 12X2.7X 3 LD (Screw) ×1 IMPLANT
SCREW LOCK 14X2.7X 3 LD TPR (Screw) ×1 IMPLANT
SCREW LOCK 16X2.7X 3 LD TPR (Screw) ×2 IMPLANT
SCREW LOCKING 2.7X12MM (Screw) ×2 IMPLANT
SCREW LOCKING 2.7X14 (Screw) ×2 IMPLANT
SCREW LOCKING 2.7X16 (Screw) ×4 IMPLANT
SPONGE LAP 4X18 X RAY DECT (DISPOSABLE) IMPLANT
SUT MNCRL AB 4-0 PS2 18 (SUTURE) ×3 IMPLANT
SUT PROLENE 3 0 PS 2 (SUTURE) IMPLANT
SUT VIC AB 3-0 FS2 27 (SUTURE) IMPLANT
SYR CONTROL 10ML LL (SYRINGE) IMPLANT
SYSTEM CHEST DRAIN TLS 7FR (DRAIN) ×3 IMPLANT
TOWEL OR 17X24 6PK STRL BLUE (TOWEL DISPOSABLE) ×3 IMPLANT
TOWEL OR 17X26 10 PK STRL BLUE (TOWEL DISPOSABLE) ×3 IMPLANT
TUBE CONNECTING 12'X1/4 (SUCTIONS) ×1
TUBE CONNECTING 12X1/4 (SUCTIONS) ×2 IMPLANT
TUBE EVACUATION TLS (MISCELLANEOUS) ×3 IMPLANT
UNDERPAD 30X30 INCONTINENT (UNDERPADS AND DIAPERS) ×3 IMPLANT
WATER STERILE IRR 1000ML POUR (IV SOLUTION) ×3 IMPLANT

## 2014-12-12 NOTE — Progress Notes (Signed)
OT Cancellation Note  Patient Details Name: Rebecca [name] MRN: [id number] DOB: 10/08/1986   Cancelled Treatment:    Reason Eval/Treat Not Completed: Other (comment). Pt to have surgery this AM, will see tomorrow for sling, edema control, and ADLs.  Almon Register [phone] 12/12/2014, 8:54 AM

## 2014-12-12 NOTE — Progress Notes (Signed)
Patient has breast pump in the room borrowed from the Pediatric Unit.  Patient has new baby at home and needed to pump to relieve pressure and discomfort.  Will need to return pump to Pediatric Unit when patient is discharged.

## 2014-12-12 NOTE — Progress Notes (Signed)
UR completed 

## 2014-12-12 NOTE — Anesthesia Postprocedure Evaluation (Signed)
Anesthesia Post Note  Patient: Rebecca [name]  Procedure(s) Performed: Procedure(s) (LRB): OPEN REDUCTION INTERNAL FIXATION (ORIF) LEFT DISTAL RADIAL FRACTURE (Left)  Anesthesia type: general  Patient location: PACU  Post pain: Pain level controlled  Post assessment: Patient's Cardiovascular Status Stable  Last Vitals:  Filed Vitals:   12/12/14 1636  BP: 117/72  Pulse: 78  Temp: 36.8 C  Resp: 17    Post vital signs: Reviewed and stable  Level of consciousness: sedated  Complications: No apparent anesthesia complications

## 2014-12-12 NOTE — Transfer of Care (Signed)
Immediate Anesthesia Transfer of Care Note  Patient: Rebecca [name]  Procedure(s) Performed: Procedure(s): OPEN REDUCTION INTERNAL FIXATION (ORIF) LEFT DISTAL RADIAL FRACTURE (Left)  Patient Location: PACU  Anesthesia Type:General  Level of Consciousness: awake, alert  and oriented  Airway & Oxygen Therapy: Patient Spontanous Breathing  Post-op Assessment: Report given to RN  Post vital signs: Reviewed and stable  Last Vitals:  Filed Vitals:   12/12/14 0504  BP: 139/77  Pulse: 59  Temp: 36.8 C  Resp: 18    Complications: No apparent anesthesia complications

## 2014-12-12 NOTE — Anesthesia Preprocedure Evaluation (Addendum)
Anesthesia Evaluation  Patient identified by MRN, date of birth, ID band Patient awake    Reviewed: Allergy & Precautions, H&P , NPO status , Patient's Chart, lab work & pertinent test results  Airway Mallampati: II  TM Distance: >3 FB Neck ROM: Full    Dental   Pulmonary Current Smoker,          Cardiovascular negative cardio ROS  Rhythm:Regular     Neuro/Psych negative neurological ROS  negative psych ROS   GI/Hepatic negative GI ROS, Neg liver ROS,   Endo/Other  negative endocrine ROS  Renal/GU negative Renal ROS     Musculoskeletal   Abdominal   Peds  Hematology   Anesthesia Other Findings   Reproductive/Obstetrics negative OB ROS                            Anesthesia Physical Anesthesia Plan  ASA: II  Anesthesia Plan: General LMA   Post-op Pain Management: MAC Combined w/ Regional for Post-op pain   Induction: Intravenous  Airway Management Planned: LMA  Additional Equipment:   Intra-op Plan:   Post-operative Plan:   Informed Consent:   Dental advisory given  Plan Discussed with: Anesthesiologist and Surgeon  Anesthesia Plan Comments:        Anesthesia Quick Evaluation

## 2014-12-12 NOTE — Op Note (Signed)
See dictation #[id number] Amedeo Plenty MD

## 2014-12-12 NOTE — Anesthesia Procedure Notes (Addendum)
Anesthesia Regional Block:  Supraclavicular block  Pre-Anesthetic Checklist: ,, timeout performed, Correct Patient, Correct Site, Correct Laterality, Correct Procedure,, site marked, risks and benefits discussed, Surgical consent,  Pre-op evaluation,  At surgeon's request and post-op pain management  Laterality: Left  Prep: chloraprep       Needles:  Injection technique: Single-shot  Needle Type: Echogenic Stimulator Needle     Needle Length: 5cm 5 cm Needle Gauge: 22 and 22 G    Additional Needles:  Procedures: ultrasound guided (picture in chart) and nerve stimulator Supraclavicular block  Nerve Stimulator or Paresthesia:  Response: bicep contraction, 0.48 mA,   Additional Responses:   Narrative:  Start time: 12/12/2014 1:41 PM End time: 12/12/2014 1:51 PM Injection made incrementally with aspirations every 5 mL.  Performed by: Personally   Additional Notes: Functioning IV was confirmed and monitors applied.  A 24mm 22ga echogenic arrow stimulator was used. Sterile prep and drape,hand hygiene and sterile gloves were used.Ultrasound guidance: relevent anatomy identified, needle position confirmed, local anesthetic spread visualized around nerve(s)., vascular puncture avoided.  Image printed for medical record.  Negative aspiration and negative test dose prior to incremental administration of local anesthetic. The patient tolerated the procedure well.   Procedure Name: LMA Insertion Date/Time: 12/12/2014 2:02 PM Performed by: Clearnce Sorrel Pre-anesthesia Checklist: Patient identified, Timeout performed, Emergency Drugs available, Suction available and Patient being monitored Patient Re-evaluated:Patient Re-evaluated prior to inductionOxygen Delivery Method: Circle system utilized Preoxygenation: Pre-oxygenation with 100% oxygen Intubation Type: IV induction LMA: LMA inserted LMA Size: 4.0 Number of attempts: 1 Placement Confirmation: positive ETCO2 Tube secured with:  Tape

## 2014-12-13 MED ORDER — METHOCARBAMOL 750 MG PO TABS
750.0000 mg | ORAL_TABLET | Freq: Four times a day (QID) | ORAL | Status: DC | PRN
  Administered 2014-12-14: 750 mg via ORAL
  Filled 2014-12-13: qty 1

## 2014-12-13 MED ORDER — METHOCARBAMOL 1000 MG/10ML IJ SOLN
500.0000 mg | Freq: Four times a day (QID) | INTRAVENOUS | Status: DC | PRN
  Filled 2014-12-13: qty 5

## 2014-12-13 MED ORDER — METHOCARBAMOL 750 MG PO TABS: 750.0000 mg | ORAL_TABLET | Freq: Four times a day (QID) | ORAL | Status: DC | PRN

## 2014-12-13 MED ORDER — HYDROMORPHONE HCL 2 MG PO TABS: 2.0000 mg | ORAL_TABLET | ORAL | Status: DC | PRN

## 2014-12-13 MED ORDER — CEPHALEXIN 500 MG PO CAPS: 500.0000 mg | ORAL_CAPSULE | Freq: Four times a day (QID) | ORAL | Status: DC

## 2014-12-13 MED ORDER — CEFAZOLIN SODIUM-DEXTROSE 2-3 GM-% IV SOLR
2.0000 g | Freq: Once | INTRAVENOUS | Status: AC
  Administered 2014-12-13: 2 g via INTRAVENOUS
  Filled 2014-12-13: qty 50

## 2014-12-13 MED ORDER — DOCUSATE SODIUM 100 MG PO CAPS: 100.0000 mg | ORAL_CAPSULE | Freq: Two times a day (BID) | ORAL | Status: DC

## 2014-12-13 MED ORDER — ASCORBIC ACID 1000 MG PO TABS: 1000.0000 mg | ORAL_TABLET | Freq: Every day | ORAL | Status: DC

## 2014-12-13 MED ORDER — HYDROMORPHONE HCL 2 MG PO TABS
2.0000 mg | ORAL_TABLET | ORAL | Status: DC | PRN
  Administered 2014-12-14: 4 mg via ORAL
  Filled 2014-12-13: qty 2

## 2014-12-13 MED ORDER — HYDROMORPHONE HCL 1 MG/ML IJ SOLN
2.0000 mg | INTRAMUSCULAR | Status: DC | PRN
  Administered 2014-12-13 – 2014-12-14 (×6): 2 mg via INTRAVENOUS
  Filled 2014-12-13 (×6): qty 2

## 2014-12-13 NOTE — Progress Notes (Signed)
Utilization Review Completed.   Aanvi Voyles, RN, BSN Nurse Case Manager  

## 2014-12-13 NOTE — Op Note (Signed)
NAMEWENDA, VANSCHAICK NO.:  [id number]  MEDICAL RECORD NO.:  [id number]  LOCATION:  [id number]                        FACILITY:  [facility]  PHYSICIAN:  Satira Anis. Delcie Ruppert, M.D.DATE OF BIRTH:  August 30, 1986  DATE OF PROCEDURE: DATE OF DISCHARGE:                              OPERATIVE REPORT   PREOPERATIVE DIAGNOSIS:  Left comminuted complex greater than three-part distal radius fracture.  POSTOPERATIVE DIAGNOSIS:  Left comminuted complex greater than three- part distal radius fracture.  PROCEDURES: 1. Open reduction and internal fixation, greater than three-part     intraarticular distal radius fracture with [employer] DVR cross-lock     plate screw system. 2. AP, lateral and oblique x-rays performed, examined and interpreted     by myself. 3. Sliding brachioradialis tenotomy, left wrist.  SURGEON:  Satira Anis. Amedeo Plenty, M.D.  ASSISTANT:  Avelina Laine, P.A.-C.  COMPLICATIONS:  None.  DRAINS:  One.  INDICATIONS:  Pleasant 29 year old female, who sustained distal radius fracture greater than 24 hours ago.  She was seen in the emergency room and stabilized by myself with closed reduction, this went uneventfully. Following this given the progressive angulation and collapse, we recommend surgical intervention.  She presents for definitive fixation to try and recreate her anatomy and give her the best as possible.  OPERATION IN DETAIL:  The patient was seen by myself and Anesthesia, taken to the operative suite and underwent smooth induction of general anesthesia, was prepped and draped in usual sterile fashion. Preoperatively, she had a block placed by Dr. Tamela Gammon, this was an excellent working block, it appeared.  Following this, the patient underwent a very careful and cautious approach to the wrist with sterile prep and drape, followed by time-out, followed by volar radial incision.  Dissection was carried down.  The FCR tendon sheath was incised palmarly and  dorsally.  Carpal canal contents retracted ulnarly.  Pronator incised, there was large amount of free bone and injured tissue with very carefully picked this away and then performed a very careful reduction followed by application of a DVR plate and screw construct.  We were able to achieve good radial height, inclination and volar tilt without difficulty.  I was pleased with the radiographic features.  I should note sliding brachioradialis tenotomy was accomplished with elevator and lessen the deforming forces of the styloid.  The patient had an excellent reduction and things looked very well.  We repaired the pronator with Vicryl.  We irrigated with greater than a liter to 2 liters of saline.  The patient had final copy x-rays taken, performed, examined and interpreted by myself, and Mr. Jenean Lindau deemed to be excellent.  She was closed with Prolene over TLS drain with hemostasis being secured nicely, there were no complicating features.  Compartments were soft.  Distal radioulnar joint stable.  Radiocarpal and mid-carpal joint stable.  She was dressed without difficulty and a short splint placed, will be made overnight for IV antibiotics, general postop observation, etc.  Do's and don'ts have been discussed and all questions have been encouraged and answered.  Should any problems arise, she will notify us; otherwise, look forward to seeing her back in the office in 12-14 days, sutures out, cast  applied at 4 weeks interval range of motion, at 8 weeks strengthening, predicated upon bony healing.  We will need to watch her closely.  We will have her set up for therapy through [facility].     Satira Anis. Amedeo Plenty, M.D.     [facility]  D:  12/12/2014  T:  12/13/2014  Job:  [id number]

## 2014-12-13 NOTE — Discharge Instructions (Signed)
Keep bandage clean and dry.  Call for any problems.  No smoking.  Criteria for driving a car: you should be off your pain medicine for 7-8 hours, able to drive one handed(confident), thinking clearly and feeling able in your judgement to drive. Continue elevation as it will decrease swelling.  If instructed by MD move your fingers within the confines of the bandage/splint.  Use ice if instructed by your MD. Call immediately for any sudden loss of feeling in your hand/arm or change in functional abilities of the extremity.  We recommend that you to take vitamin C 1000 mg a day to promote healing we also recommend that if you require her pain medicine that he take a stool softener to prevent constipation as most pain medicines will have constipation side effects. We recommend either Peri-Colace or Senokot and recommend that you also consider adding MiraLAX to prevent the constipation affects from pain medicine if you are required to use them. These medicines are over the counter and maybe purchased at a local pharmacy.

## 2014-12-13 NOTE — Evaluation (Addendum)
Occupational Therapy Evaluation Patient Details Name: Rebecca [name] MRN: [id number] DOB: [birth year] Today's Date: 12/13/2014    History of Present Illness 29 y.o. s/p OPEN REDUCTION INTERNAL FIXATION (ORIF) LEFT DISTAL RADIAL FRACTURE (Left). Pt has 84 day old newborn baby.   Clinical Impression   Pt s/p above. Pt tearful in session due to pain (OT talked to nurse after session). Feel pt will benefit from acute OT to increase independence and address Lt UE prior to d/c.     Follow Up Recommendations  No OT follow up;Supervision/Assistance - 24 hour    Equipment Recommendations  Other (comment) (tbd)    Recommendations for Other Services       Precautions / Restrictions Precautions Precaution Comments: elevation, edema control, ADLs; no pushing, pulling, lifting with Lt UE Restrictions Weight Bearing Restrictions: Yes LUE Weight Bearing: Weight bear through elbow only      Mobility Bed Mobility               General bed mobility comments: not assessed but explained how to do so  Transfers                 General transfer comment: not assessed but explained not to push up with left wrist/hand.         ADL Overall ADL's : Needs assistance/impaired     Grooming: Minimal assistance;Bed level           Upper Body Dressing : Moderate assistance;Bed level                     General ADL Comments: Educated on precautions of Lt UE and educated on UB dressing technique. Explained how she could use left hand to hold light items. Pt not wanting to get out of bed as baby was asleep in her lap in session-reports she has been moving well and nurse also reports this.  Educated on edema management techniques.     Vision                     Perception     Praxis      Pertinent Vitals/Pain Pain Assessment: 0-10 Pain Score: 10-Worst pain ever Pain Location: Lt wrist area Pain Descriptors / Indicators: Throbbing Pain Intervention(s):  Repositioned;Monitored during session;Limited activity within patient's tolerance;Other (comment);Patient requesting pain meds-RN notified     Hand Dominance Right   Extremity/Trunk Assessment Upper Extremity Assessment Upper Extremity Assessment: LUE deficits/detail LUE Deficits / Details: edema in hand and digits limiting ROM of fingers LUE: Unable to fully assess due to immobilization           Communication Communication Communication: No difficulties   Cognition Arousal/Alertness: Awake/alert Behavior During Therapy: WFL for tasks assessed/performed Overall Cognitive Status: Within Functional Limits for tasks assessed                     General Comments       Exercises Exercises: Other exercises Other Exercises Other Exercises: OT performed retrograde massage to Lt digits/hand; educated how to do so Other Exercises: Encouraged pt to be moving digits and pt did so minimally-explained how she could use other hand to assist   Shoulder Instructions      Home Living Family/patient expects to be discharged to:: Private residence Living Arrangements: Spouse/significant other Available Help at Discharge: Available 24 hours/day (significant other with her at home) Type of Home: Apartment Home Access: Stairs to enter [employer] of Steps: 13 Entrance  Stairs-Rails: Left Home Layout: One level (they live on third level; no steps once you get in apartment)     Bathroom Shower/Tub: Teacher, early years/pre: Standard                Prior Functioning/Environment Level of Independence: Independent             OT Diagnosis: Acute pain   OT Problem List: Decreased activity tolerance;Decreased range of motion;Pain;Impaired UE functional use;Increased edema;Decreased knowledge of use of DME or AE;Decreased coordination   OT Treatment/Interventions: Self-care/ADL training;Therapeutic exercise;DME and/or AE instruction;Therapeutic  activities;Patient/family education;Balance training    OT Goals(Current goals can be found in the care plan section) Acute Rehab OT Goals Patient Stated Goal: not stated OT Goal Formulation: With patient Time For Goal Achievement: 12/20/14 Potential to Achieve Goals: Good ADL Goals Additional ADL Goal #1: Pt will be independent with edema management techniques for Lt UE. Additional ADL Goal #2: Pt/caregiver will be independent with ADLs while maintaining precautions.  OT Frequency: Min 2X/week   Barriers to D/C:            Co-evaluation              End of Session Nurse Communication: Patient requests pain meds (elevate Lt UE)  Activity Tolerance: Patient limited by pain;Other (comment) (tearful in session) Patient left: in bed;with call bell/phone within reach;with family/visitor present   Time: 4034-7425 OT Time Calculation (min): 17 min Charges:  OT General Charges $OT Visit: 1 Procedure OT Evaluation $Initial OT Evaluation Tier I: 1 Procedure G-Codes: OT G-codes **NOT FOR INPATIENT CLASS** Functional Assessment Tool Used: clinical judgment Functional Limitation: Self care Self Care Current Status (Z5638): At least 20 percent but less than 40 percent impaired, limited or restricted Self Care Goal Status (V5643): 0 percent impaired, limited or restricted  Benito Mccreedy OTR/L [phone] 12/13/2014, 9:27 AM

## 2014-12-13 NOTE — Discharge Summary (Signed)
  Patient has been seen and examined. Patient has pain appropriate to his injury/process. Patient denies new complaints at this present time. I have discussed his care with nursing staff. Patient is appropriate and alert.  We reviewed vital signs and intake output which are stable.  The upper extremity is neurovascularly intact. Refill is normal. There is no signs of compartment syndrome. There is no signs of dystrophy. There is normal sensation.  I have spent a  great deal of time discussing range of motion edema control and other techniques to decrease edema and promote flexion extension of the fingers. Patient understands the importance of elevation range of motion massage and other measures to lessen pain and prevent swelling.  We have discussed with the patient shoulder range of motion to prevent adhesive capsulitis.  The remainder of the examination is normal today without complicating feature  Her sensation looks excellent. She has good range of motion to the fingers after working with her. She demonstrates a little bit of hyperextension of the ring finger however there is no palpable defect and there is no evidence of advanced ulnar nerve lesion. I suspect this is a slight residual from her block. I discussed her these issues. Although she could have a bruise to her ulnar nerve -we would simply watch this. Her sensation to the ulnar nerve is completely intact at present time according to her report.  Drain was removed without difficulty  Patient will be discharged home. Will plan to see the patient back in the off as per discharge instructions (please see discharge instructions).  Patient had an uneventful hospital course. At the time of discharge patient is stable awake alert and oriented in no acute distress. Regular diet will be continued and has been tolerated. Patient will notify should have problems occur. There is no signs of DVT infection or other complication at this  juncture.  All questions have been incurred and answered.  Overall she is done well. Final diagnosis status post ORIF left distal radius fracture comminuted complex.  Regular diet  Follow-up in 12-14 days  Cornie Mccomber M.D.

## 2014-12-14 ENCOUNTER — Encounter (HOSPITAL_COMMUNITY): Payer: Self-pay | Admitting: Orthopedic Surgery

## 2014-12-14 MED ORDER — CEFAZOLIN SODIUM-DEXTROSE 2-3 GM-% IV SOLR
2.0000 g | Freq: Once | INTRAVENOUS | Status: AC
  Administered 2014-12-14: 2 g via INTRAVENOUS
  Filled 2014-12-14: qty 50

## 2015-01-11 ENCOUNTER — Ambulatory Visit: Payer: Medicaid Other | Attending: Orthopedic Surgery | Admitting: Occupational Therapy

## 2015-01-11 DIAGNOSIS — M25642 Stiffness of left hand, not elsewhere classified: Secondary | ICD-10-CM

## 2015-01-11 DIAGNOSIS — M79642 Pain in left hand: Secondary | ICD-10-CM

## 2015-01-11 NOTE — Therapy (Signed)
[facility] [address] [place], [place], [address] Phone: [phone]   Fax:  [phone]  Occupational Therapy Evaluation  Patient Details  Name: Rebecca [name] MRN: [id number] Date of Birth: 19-Dec-1985 Referring Provider:  Ivan Croft, PA-C  Encounter Date: 01/11/2015      OT End of Session - 01/11/15 1055    Visit Number 1   Date for OT Re-Evaluation 04/05/15   Authorization Type Awaiting MCD authorization   OT Start Time 0930   OT Stop Time 1045   OT Time Calculation (min) 75 min   Equipment Utilized During Treatment splint, HEP   Activity Tolerance Patient tolerated treatment well;Patient limited by pain      Past Medical History  Diagnosis Date  . Urinary tract infection   . Abnormal Pap smear   . Infection     trich, chlamydia    Past Surgical History  Procedure Laterality Date  . Induced abortion    . Open reduction internal fixation (orif) distal radial fracture Left 12/12/2014    Procedure: OPEN REDUCTION INTERNAL FIXATION (ORIF) LEFT DISTAL RADIAL FRACTURE;  Surgeon: Roseanne Kaufman, MD;  Location: [facility];  Service: Orthopedics;  Laterality: Left;    There were no vitals taken for this visit.  Visit Diagnosis:  Pain, hand joint, left - Plan: Ot plan of care cert/re-cert  Stiffness of joint, hand, left - Plan: Ot plan of care cert/re-cert      Subjective Assessment - 01/11/15 1017    Patient Stated Goals to use my wrist and hand like I did before the injury   Currently in Pain? Yes   Pain Score 5   with movement, 0 at rest   Pain Location Wrist   Pain Orientation Left   Pain Descriptors / Indicators Aching   Pain Type Surgical pain   Pain Onset 1 to 4 weeks ago   Pain Frequency Intermittent   Aggravating Factors  movement   Pain Relieving Factors rest, OTC meds          [facility] OT Assessment - 01/11/15 1019    Assessment   Diagnosis s/p ORIF Lt distal radius from fracture  (due to fall). Surgery 12/12/14   Onset Date 12/12/14   Assessment Pt arrived wrapped and protected in soft cast from MD appt. on 01/07/15   Precautions   Precautions Other (comment)   Precaution Comments gentle A/ROM in wrist flex/ext OK, hold supination/pronation for 2 wks from 01/07/15 (until 01/21/15), full ROM (P/ROM) at 8 wks post-op (after 02/06/15), and strengthening at 10 wks post-op (after 02/20/15) all per MD orders   Required Braces or Orthoses Other Brace/Splint  to wear wrist splint between exercises and at night   [place] With --  boyfriend, 3 children (has 28 month old child)   Prior Function   Level of Independence Independent with basic ADLs;Independent with homemaking with ambulation   Vocation Full time employment   [place]   ADL comments Pt modified independence with all BADLS per pt report, however requires assist from family and boyfriend for [place] and care of 59 month old child.    Written Expression   Dominant Hand Right   Edema   Edema mild at Lt thumb and wrist   ROM / Strength   AROM / PROM / Strength AROM   AROM   Overall AROM  Deficits   Overall AROM Comments elbow flex/ext WNL's, wrist  flex = 25*, ext = 33*, gross finger flexion approx. 90%, gross finger ext WFL's. Thumb can oppose to 5th digit, however limited MP flexion and radial abduction.                 OT Treatments/Exercises (OP) - 01/11/15 1030    Exercises   Exercises Wrist;Hand   Wrist Exercises   Other wrist exercises Pt shown gentle A/ROM in wrist flex/ext per MD orders. Pt also instructed to perform elbow flex/ext, gross finger flex/and ext, and thumb opposition, flexion and radial abduction 6-8x/day. Pt advised not to perform supination/pronation until after 01/21/15 (Per MD orders - hold 2 wks from 01/07/15 order)   Splinting   Splinting Fabricated and fitted wrist splint with wrist in approx. 10* wrist extension and fingers free per  MD orders.                OT Education - 01/11/15 1041    Education provided Yes   Education Details Splint wear and care; A/ROM HEP for elbow flex/ext, wrist flex/ext, finger and thumb A/ROM (supination/pronation to begin after 01/21/15 per MD orders)   Person(s) Educated Patient   Methods Explanation;Demonstration;Handout   Comprehension Verbalized understanding;Returned demonstration          OT Short Term Goals - 01/11/15 1214    OT SHORT TERM GOAL #1   Title Independent w/ splint wear and care   Baseline issued, may need adjustments   Time 4   Period Weeks   Status New   OT SHORT TERM GOAL #2   Title Independent w/ initial HEP    Baseline issued, may need updated and review   Time 4   Period Weeks   Status New           OT Long Term Goals - 01/11/15 1215    OT LONG TERM GOAL #1   Title Independent w/ updated HEP (DUE 04/05/15)   Baseline DEPENDENT d/t current precautions   Time 12   Period Weeks   Status New   OT LONG TERM GOAL #2   Title Pt to return to using Lt hand as assist for bilateral tasks   Baseline dependent d/t current precautions   Time 12   Period Weeks   Status New   OT LONG TERM GOAL #3   Title Pt to report pain less than or equal to 3/10 Lt wrist with all functional tasks    Baseline 5/10 with only AROM   Time 12   Period Weeks   Status New   OT LONG TERM GOAL #4   Title Grip strength to be 20 lbs or greater Lt hand to assist with opening jars/bottles   Baseline unable to assess d/t current precautions   Time 12   Period Weeks   Status New   OT LONG TERM GOAL #5   Title Wrist flexion and extension to be 40 degrees or greater for functional tasks   Baseline flex = 25*, ext = 33*   Time 12   Period Weeks   Status New               Plan - 01/11/15 1056    Clinical Impression Statement Pt is a 29 y.o. female who presents to outpatient rehab s/p ORIF Lt distal radius (40102) on 12/12/14 from fracture (due to fall) for  splinting purposes and to begin ROM. Pt arrived fully wrapped and protected. Soft cast was removed and hand/wrist cleaned before fabricating splint. Splint was  fabricated and fitted and HEP issued today per MD orders.    Pt will benefit from skilled therapeutic intervention in order to improve on the following deficits (Retired) Decreased coordination;Decreased range of motion;Increased edema;Decreased knowledge of precautions;Impaired UE functional use;Pain;Decreased strength;Decreased mobility;Impaired flexibility   Rehab Potential Fair   Clinical Impairments Affecting Rehab Potential MCD visit limit   OT Frequency --  3 visits over 12 week duration (unable to begin strengthening until 10 wks post-op per MD orders)   OT Treatment/Interventions Self-care/ADL training;Moist Heat;Fluidtherapy;DME and/or AE instruction;Splinting;Patient/family education;Contrast Bath;Therapeutic exercises;Therapeutic exercise;Scar mobilization;Therapeutic activities;Passive range of motion;Electrical Stimulation;Manual Therapy   Plan bring back week of 02/08/15 to begin full ROM including P/ROM (Per MD orders), review A/ROM HEP previously issued, check for MCD authorization (next visit week of 02/22/15 to begin strengthening)   Consulted and Agree with Plan of Care Patient        Problem List Patient Active Problem List   Diagnosis Date Noted  . Radius distal fracture 12/11/2014  . NSVD (normal spontaneous vaginal delivery) 12/03/2014  . Post-dates pregnancy 12/02/2014  . Supervision of other normal pregnancy 06/17/2014  . Smoker 10/29/2013    Carey Bullocks, OTR/L 01/11/2015, 12:23 PM  [facility] [address] [place], [place], [address] Phone: [phone]   Fax:  [phone]

## 2015-01-11 NOTE — Patient Instructions (Signed)
SPLINT WEAR AND CARE:   WEARING SCHEDULE:  Wear splint at ALL times except for hygiene care (May remove splint for exercises and then immediately place back on ONLY if directed by the therapist). May remove splint to perform exercises 6-8x/day (see below)  PURPOSE:  To prevent movement and for protection until injury can heal  CARE OF SPLINT:  Keep splint away from heat sources including: stove, radiator or furnace, or a car in sunlight. The splint can melt and will no longer fit you properly  Keep away from pets and children  Clean the splint with rubbing alcohol 1-2 times per day.  * During this time, make sure you also clean your hand/arm as instructed by your therapist and/or perform dressing changes as needed. Then dry hand/arm completely before replacing splint. (When cleaning hand/arm, keep it immobilized in same position until splint is replaced)  PRECAUTIONS/POTENTIAL PROBLEMS: *If you notice or experience increased pain, swelling, numbness, or a lingering reddened area from the splint: Contact your therapist immediately by calling [phone]. You must wear the splint for protection, but we will get you scheduled for adjustments as quickly as possible.  (If only straps or hooks need to be replaced and NO adjustments to the splint need to be made, just call the office ahead and let them know you are coming in)  If you have any medical concerns or signs of infection, please call your doctor immediately   EXERCISES:   AROM: Elbow Flexion / Extension    Gently bend elbow as far as possible. Then straighten arm as far as possible. Repeat _10-15___ times per set. Do _4-6___ sessions per day.  AROM: Wrist Extension   .  With ____ palm down, bend wrist up. Repeat __15__ times per set.  Do __4-6__ sessions per day.    AROM: Wrist Flexion   With_____ palm up, bend wrist up. Repeat __15__ times per set.  Do _4-6___ sessions per day.   Flexor Tendon Gliding (Active Hook  Fist)   With fingers and knuckles straight, bend middle and tip joints. Do not bend large knuckles. Repeat _10-15___ times. Do _4-6___ sessions per day.  MP Flexion (Active)   With back of hand on table, bend large knuckles as far as they will go, keeping small joints straight. Repeat _10-15___ times. Do __4-6__ sessions per day. Activity: Reach into a narrow container.*      Finger Flexion / Extension   With palm up, bend fingers of left hand toward palm, making a  fist. Straighten fingers, opening fist. Repeat sequence _10-15___ times per session. Do _4-6__ sessions per day. Hand Variation: Palm down   Opposition (Active)   Touch tip of thumb to nail tip of each finger in turn, making an "O" shape. Repeat __10__ times. Do _4-6___ sessions per day.   MP Flexion (Active)   Bend thumb to touch base of little finger, keeping tip joint straight. Repeat __10-15__ times. Do _4-6___ sessions per day.       Composite Extension (Active)   Bring thumb up and out in hitchhiker position.  Repeat __10-15__ times. Do _4-6___ sessions per day.            Elbow / Wrist Supination / Pronation: Do NOT begin until 01/21/15   Bend elbow(s) to 90 and hold close to body. Turn palm(s) up. Then turn palm(s) down. Keep wrist straight. Repeat sequence _10-15___ times per session. Do _4-6___ sessions per day.

## 2015-01-25 ENCOUNTER — Encounter: Payer: Self-pay | Admitting: Occupational Therapy

## 2015-01-25 ENCOUNTER — Ambulatory Visit: Payer: Medicaid Other | Attending: Orthopedic Surgery | Admitting: Occupational Therapy

## 2015-01-25 DIAGNOSIS — M25642 Stiffness of left hand, not elsewhere classified: Secondary | ICD-10-CM | POA: Diagnosis not present

## 2015-01-25 DIAGNOSIS — M79642 Pain in left hand: Secondary | ICD-10-CM | POA: Diagnosis not present

## 2015-01-25 NOTE — Therapy (Signed)
[facility] [address] [place], [place], [address] Phone: [phone]   Fax:  [phone]  Occupational Therapy Treatment  Patient Details  Name: Rebecca [name] MRN: [id number] Date of Birth: 20-Sep-1986 Referring Provider:  Avelina Laine, PA-C  Encounter Date: 01/25/2015      OT End of Session - 01/25/15 1132    Visit Number 2   Date for OT Re-Evaluation 04/05/15   Authorization Type MCD   Authorization Time Period 01/13/15 - 04/14/15   Authorization - Visit Number 1   Authorization - Number of Visits 3   OT Start Time 1100   OT Stop Time 1145   OT Time Calculation (min) 45 min   Activity Tolerance Patient tolerated treatment well      Past Medical History  Diagnosis Date  . Urinary tract infection   . Abnormal Pap smear   . Infection     trich, chlamydia    Past Surgical History  Procedure Laterality Date  . Induced abortion    . Open reduction internal fixation (orif) distal radial fracture Left 12/12/2014    Procedure: OPEN REDUCTION INTERNAL FIXATION (ORIF) LEFT DISTAL RADIAL FRACTURE;  Surgeon: Roseanne Kaufman, MD;  Location: [facility];  Service: Orthopedics;  Laterality: Left;    There were no vitals filed for this visit.  Visit Diagnosis:  Pain, hand joint, left  Stiffness of joint, hand, left      Subjective Assessment - 01/25/15 1111    Symptoms The splint is uncomfortable b/c it's hard but no complications   Currently in Pain? Yes   Pain Score 5    Pain Location Wrist   Pain Orientation Left   Pain Descriptors / Indicators Aching   Pain Type Surgical pain   Pain Onset 1 to 4 weeks ago   Pain Frequency Intermittent   Aggravating Factors  movement   Pain Relieving Factors rest, OTC meds                    OT Treatments/Exercises (OP) - 01/25/15 1114    Exercises   Exercises Elbow   Elbow Exercises   Other elbow exercises Began gentle A/ROM in supination and pronation  today (OK per MD) keeping elbow flexed at 90* angle.    Wrist Exercises   Other wrist exercises Pt performing A/ROM in wrist flexion and extension with cues to perform slower and go through full range as able. Pt also instructed to keep fingers flexed with wrist extension to ensure movement is coming from wrist (and not finger extensors). Pt instructed to keep fingers flexed in wrist flexion to stretch extensor muscles.    Modalities   Modalities Fluidotherapy   LUE Fluidotherapy   Number Minutes Fluidotherapy 12 Minutes   LUE Fluidotherapy Location Hand;Wrist   Comments To decrease stiffness and pain; and for desensitization                OT Education - 01/25/15 1127    Education provided Yes   Education Details HEP for A/ROM in supination and pronation  (In addition to previous HEP)   Person(s) Educated Patient   Methods Explanation;Demonstration;Handout   Comprehension Verbalized understanding;Returned demonstration          OT Short Term Goals - 01/11/15 1214    OT SHORT TERM GOAL #1   Title Independent w/ splint wear and care   Baseline issued, may need adjustments   Time 4   Period Weeks   Status New  OT SHORT TERM GOAL #2   Title Independent w/ initial HEP    Baseline issued, may need updated and review   Time 4   Period Weeks   Status New           OT Long Term Goals - 01/11/15 1215    OT LONG TERM GOAL #1   Title Independent w/ updated HEP (DUE 04/05/15)   Baseline DEPENDENT d/t current precautions   Time 12   Period Weeks   Status New   OT LONG TERM GOAL #2   Title Pt to return to using Lt hand as assist for bilateral tasks   Baseline dependent d/t current precautions   Time 12   Period Weeks   Status New   OT LONG TERM GOAL #3   Title Pt to report pain less than or equal to 3/10 Lt wrist with all functional tasks    Baseline 5/10 with only AROM   Time 12   Period Weeks   Status New   OT LONG TERM GOAL #4   Title Grip strength to be 20  lbs or greater Lt hand to assist with opening jars/bottles   Baseline unable to assess d/t current precautions   Time 12   Period Weeks   Status New   OT LONG TERM GOAL #5   Title Wrist flexion and extension to be 40 degrees or greater for functional tasks   Baseline flex = 25*, ext = 33*   Time 12   Period Weeks   Status New               Plan - 01/25/15 1139    Clinical Impression Statement Pt with increased pain during A/ROM ex's to wrist and forearm, however after repetition, pt with observable increase in ROM and decreased stiffness.    Plan bring back week of 02/08/15 to begin P/ROM and assess STG's, bring back again week of 02/22/15 to begin strengthening and d/c 02/22/15 due to MCD visit limit        Problem List Patient Active Problem List   Diagnosis Date Noted  . Radius distal fracture 12/11/2014  . NSVD (normal spontaneous vaginal delivery) 12/03/2014  . Post-dates pregnancy 12/02/2014  . Supervision of other normal pregnancy 06/17/2014  . Smoker 10/29/2013    Carey Bullocks, OTR/L 01/25/2015, 11:45 AM  [facility] [address] [place], [place], [address] Phone: [phone]   Fax:  [phone]

## 2015-01-25 NOTE — Patient Instructions (Signed)
    Elbow / Wrist Supination / Pronation   Bend elbow(s) to 90 and hold close to body. Turn Lt palm(s) up. Then turn Lt palm(s) down. Keep wrist straight. Repeat sequence _10-15___ times each way per session. Do _6___ sessions per day.

## 2015-02-02 ENCOUNTER — Encounter: Payer: Medicaid Other | Admitting: Occupational Therapy

## 2015-02-11 ENCOUNTER — Ambulatory Visit: Payer: Medicaid Other | Admitting: Occupational Therapy

## 2015-02-11 DIAGNOSIS — M79642 Pain in left hand: Secondary | ICD-10-CM | POA: Diagnosis not present

## 2015-02-11 DIAGNOSIS — M25642 Stiffness of left hand, not elsewhere classified: Secondary | ICD-10-CM

## 2015-02-11 NOTE — Patient Instructions (Signed)
PROM: Wrist Flexion / Extension   Grasp  hand and slowly bend wrist until stretch is felt. Relax. Then stretch as far as possible in opposite direction. (Can do "prayer" stretch for upwards motion as shown in clinic)  Hold __10__ sec. each way Repeat _5___ times per set.    Do _4-6___ sessions per day.  Pronation (Passive)   Keep elbow bent at right angle and held firmly to side. Use other hand to turn forearm until palm faces downward. Hold _10___ seconds. Repeat __5__ times. Do _4-6___ sessions per day.  Supination (Passive)   Keep elbow bent at right angle and held firmly at side. Use other hand to turn forearm until palm faces upward. Hold __10__ seconds. Repeat __5__ times. Do _4-6___ sessions per day.  Copyright  VHI. All rights reserved.

## 2015-02-11 NOTE — Therapy (Signed)
[facility] [address] [place], [place], [address] Phone: [phone]   Fax:  [phone]  Occupational Therapy Treatment  Patient Details  Name: Rebecca [name] MRN: [id number] Date of Birth: 08/27/1986 Referring Provider:  Avelina Laine, PA-C  Encounter Date: 02/11/2015      OT End of Session - 02/11/15 1609    Visit Number 3   Date for OT Re-Evaluation 04/05/15   Authorization Type MCD   Authorization Time Period 01/13/15 - 04/14/15   Authorization - Visit Number 2   Authorization - Number of Visits 3   OT Start Time 5462   OT Stop Time 1615   OT Time Calculation (min) 40 min   Activity Tolerance Patient tolerated treatment well      Past Medical History  Diagnosis Date  . Urinary tract infection   . Abnormal Pap smear   . Infection     trich, chlamydia    Past Surgical History  Procedure Laterality Date  . Induced abortion    . Open reduction internal fixation (orif) distal radial fracture Left 12/12/2014    Procedure: OPEN REDUCTION INTERNAL FIXATION (ORIF) LEFT DISTAL RADIAL FRACTURE;  Surgeon: Roseanne Kaufman, MD;  Location: [facility];  Service: Orthopedics;  Laterality: Left;    There were no vitals filed for this visit.  Visit Diagnosis:  Pain, hand joint, left  Stiffness of joint, hand, left      Subjective Assessment - 02/11/15 1540    Symptoms My wrist up and down motion is better   Patient Stated Goals to use my wrist and hand like I did before the injury   Currently in Pain? Yes   Pain Location Wrist   Pain Orientation Left   Pain Descriptors / Indicators Aching;Throbbing   Pain Type Surgical pain   Pain Onset 1 to 4 weeks ago   Pain Frequency Intermittent   Aggravating Factors  movement   Pain Relieving Factors rest, OTC meds            [facility] OT Assessment - 02/11/15 0001    ROM / Strength   AROM / PROM / Strength AROM  wrist flex = 55*, ext = 42*, sup = 58*, pron = 72*                 OT Treatments/Exercises (OP) - 02/11/15 1542    Elbow Exercises   Other elbow exercises A/ROM in supination and pronation x 15 reps each way   Other elbow exercises Began P/ROM in supination and pronation (gently) x 5 reps holding 10 sec. each way   Wrist Exercises   Other wrist exercises A/ROM in wrist flexion and extension x 15 reps each way   Other wrist exercises Began P/ROM in wrist flexion and extension holding 10 sec. x 5 reps each way   LUE Fluidotherapy   Number Minutes Fluidotherapy 12 Minutes   LUE Fluidotherapy Location Hand;Wrist   Comments to decrease stiffness and pain                OT Education - 02/11/15 1603    Education provided Yes   Education Details P/ROM HEP for wrist flex/ext and forearm sup/pron   Person(s) Educated Patient   Methods Explanation;Demonstration;Handout   Comprehension Verbalized understanding;Returned demonstration          OT Short Term Goals - 02/11/15 1610    OT SHORT TERM GOAL #1   Title Independent w/ splint wear and care   Baseline  issued, may need adjustments   Time 4   Period Weeks   Status Achieved   OT SHORT TERM GOAL #2   Title Independent w/ initial HEP    Baseline issued, may need updated and review   Time 4   Period Weeks   Status Achieved           OT Long Term Goals - 01/11/15 1215    OT LONG TERM GOAL #1   Title Independent w/ updated HEP (DUE 04/05/15)   Baseline DEPENDENT d/t current precautions   Time 12   Period Weeks   Status New   OT LONG TERM GOAL #2   Title Pt to return to using Lt hand as assist for bilateral tasks   Baseline dependent d/t current precautions   Time 12   Period Weeks   Status New   OT LONG TERM GOAL #3   Title Pt to report pain less than or equal to 3/10 Lt wrist with all functional tasks    Baseline 5/10 with only AROM   Time 12   Period Weeks   Status New   OT LONG TERM GOAL #4   Title Grip strength to be 20 lbs or greater Lt hand to  assist with opening jars/bottles   Baseline unable to assess d/t current precautions   Time 12   Period Weeks   Status New   OT LONG TERM GOAL #5   Title Wrist flexion and extension to be 40 degrees or greater for functional tasks   Baseline flex = 25*, ext = 33*   Time 12   Period Weeks   Status New               Plan - 02/11/15 1610    Clinical Impression Statement Pt met STG's. Wrist and forearm A/ROM has improved. Pt still with higher pain during P/ROM initiated today.    Plan bring back week of 02/22/15 to begin hand and wrist/forearm strengthening and d/c (due to MCD visit limit)   Consulted and Agree with Plan of Care Patient        Problem List Patient Active Problem List   Diagnosis Date Noted  . Radius distal fracture 12/11/2014  . NSVD (normal spontaneous vaginal delivery) 12/03/2014  . Post-dates pregnancy 12/02/2014  . Supervision of other normal pregnancy 06/17/2014  . Smoker 10/29/2013    Carey Bullocks, OTR/L 02/11/2015, 4:14 PM  [facility] [address] [place], [place], [address] Phone: [phone]   Fax:  [phone]

## 2015-02-25 ENCOUNTER — Ambulatory Visit: Payer: Medicaid Other | Attending: Orthopedic Surgery | Admitting: Occupational Therapy

## 2015-02-25 DIAGNOSIS — M25642 Stiffness of left hand, not elsewhere classified: Secondary | ICD-10-CM | POA: Insufficient documentation

## 2015-02-25 DIAGNOSIS — M79642 Pain in left hand: Secondary | ICD-10-CM | POA: Insufficient documentation

## 2015-03-23 ENCOUNTER — Encounter: Payer: Self-pay | Admitting: Occupational Therapy

## 2015-03-23 NOTE — Therapy (Signed)
[facility] [address] [place], [place], [address] Phone: [phone]   Fax:  [phone]  Patient Details  Name: Rebecca [name] MRN: [id number] Date of Birth: January 22, 1986 Referring Provider:  No ref. provider found  Encounter Date: 03/23/2015  OCCUPATIONAL THERAPY DISCHARGE SUMMARY  Visits from Start of Care: 3  Current functional level related to goals / functional outcomes:     OT Short Term Goals - 02/11/15 1610    OT SHORT TERM GOAL #1   Title Independent w/ splint wear and care   Baseline issued, may need adjustments   Time 4   Period Weeks   Status Achieved   OT SHORT TERM GOAL #2   Title Independent w/ initial HEP    Baseline issued, may need updated and review   Time 4   Period Weeks   Status Achieved         OT Long Term Goals - 01/11/15 1215    OT LONG TERM GOAL #1   Title Independent w/ updated HEP (DUE 04/05/15)   Baseline DEPENDENT d/t current precautions   Time 12   Period Weeks   Status New   OT LONG TERM GOAL #2   Title Pt to return to using Lt hand as assist for bilateral tasks   Baseline dependent d/t current precautions   Time 12   Period Weeks   Status UNABLE TO ISSUE SECONDARY TO PT NOT RETURNING FOR LAST SCHEDULED VISIT   OT LONG TERM GOAL #3   Title Pt to report pain less than or equal to 3/10 Lt wrist with all functional tasks    Baseline 5/10 with only AROM   Time 12   Period Weeks   Status UNKNOWN - PT DID NOT RETURN   OT LONG TERM GOAL #4   Title Grip strength to be 20 lbs or greater Lt hand to assist with opening jars/bottles   Baseline unable to assess d/t current precautions   Time 12   Period Weeks   Status UNABLE TO ASSESS - PT DID NOT RETURN   OT LONG TERM GOAL #5   Title Wrist flexion and extension to be 40 degrees or greater for functional tasks   Baseline flex = 25*, ext = 33*   Time 12   Period Weeks   Status ACHIEVED 02/11/15 (wrist flex = 55*, ext =  42*)        Remaining deficits: Strength Lt hand and wrist ROM Lt wrist and forearm Pain   Education / Equipment: Pt provided w/ splint wear and care, A/ROM and P/ROM HEP  Plan: Patient agrees to discharge.  Patient goals were partially met. Patient is being discharged due to not returning since the last visit.  (Pt was called to reschedule last appt. But she never called back ?????        Carey Bullocks, OTR/L 03/23/2015, 10:10 AM  [facility] [address] [place], [place], [address] Phone: [phone]   Fax:  [phone]

## 2015-03-31 ENCOUNTER — Encounter (HOSPITAL_COMMUNITY): Payer: Self-pay | Admitting: Emergency Medicine

## 2015-03-31 ENCOUNTER — Emergency Department (HOSPITAL_COMMUNITY)
Admission: EM | Admit: 2015-03-31 | Discharge: 2015-03-31 | Disposition: A | Discharge status: Home / Self Care | Payer: Medicaid Other | Attending: Emergency Medicine | Admitting: Emergency Medicine

## 2015-03-31 ENCOUNTER — Emergency Department (HOSPITAL_COMMUNITY): Payer: Medicaid Other

## 2015-03-31 DIAGNOSIS — Y9389 Activity, other specified: Secondary | ICD-10-CM | POA: Insufficient documentation

## 2015-03-31 DIAGNOSIS — Z79899 Other long term (current) drug therapy: Secondary | ICD-10-CM | POA: Insufficient documentation

## 2015-03-31 DIAGNOSIS — Z720 Tobacco use: Secondary | ICD-10-CM | POA: Insufficient documentation

## 2015-03-31 DIAGNOSIS — Z792 Long term (current) use of antibiotics: Secondary | ICD-10-CM | POA: Insufficient documentation

## 2015-03-31 DIAGNOSIS — Z8619 Personal history of other infectious and parasitic diseases: Secondary | ICD-10-CM | POA: Insufficient documentation

## 2015-03-31 DIAGNOSIS — Y9289 Other specified places as the place of occurrence of the external cause: Secondary | ICD-10-CM | POA: Insufficient documentation

## 2015-03-31 DIAGNOSIS — Y998 Other external cause status: Secondary | ICD-10-CM | POA: Insufficient documentation

## 2015-03-31 DIAGNOSIS — Z87440 Personal history of urinary (tract) infections: Secondary | ICD-10-CM | POA: Insufficient documentation

## 2015-03-31 DIAGNOSIS — S99921A Unspecified injury of right foot, initial encounter: Secondary | ICD-10-CM | POA: Insufficient documentation

## 2015-03-31 DIAGNOSIS — M25571 Pain in right ankle and joints of right foot: Secondary | ICD-10-CM

## 2015-03-31 MED ORDER — OXYCODONE-ACETAMINOPHEN 5-325 MG PO TABS
1.0000 | ORAL_TABLET | Freq: Once | ORAL | Status: AC
  Administered 2015-03-31: 1 via ORAL
  Filled 2015-03-31: qty 1

## 2015-03-31 MED ORDER — HYDROCODONE-ACETAMINOPHEN 5-325 MG PO TABS: 1.0000 | ORAL_TABLET | ORAL | Status: DC | PRN

## 2015-03-31 MED ORDER — IBUPROFEN 600 MG PO TABS: 600.0000 mg | ORAL_TABLET | Freq: Four times a day (QID) | ORAL | Status: DC | PRN

## 2015-03-31 NOTE — ED Notes (Signed)
Pt sts injury and pain to right 5th digit on toe; pt sts pain worse with movement

## 2015-03-31 NOTE — Discharge Instructions (Signed)
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
## 2015-10-21 ENCOUNTER — Emergency Department (HOSPITAL_COMMUNITY)
Admission: EM | Admit: 2015-10-21 | Discharge: 2015-10-21 | Disposition: A | Discharge status: Home / Self Care | Payer: Medicaid Other | Attending: Emergency Medicine | Admitting: Emergency Medicine

## 2015-10-21 ENCOUNTER — Emergency Department (HOSPITAL_COMMUNITY): Payer: Medicaid Other

## 2015-10-21 ENCOUNTER — Encounter (HOSPITAL_COMMUNITY): Payer: Self-pay | Admitting: Emergency Medicine

## 2015-10-21 DIAGNOSIS — F17210 Nicotine dependence, cigarettes, uncomplicated: Secondary | ICD-10-CM | POA: Diagnosis not present

## 2015-10-21 DIAGNOSIS — Z8619 Personal history of other infectious and parasitic diseases: Secondary | ICD-10-CM | POA: Diagnosis not present

## 2015-10-21 DIAGNOSIS — M79669 Pain in unspecified lower leg: Secondary | ICD-10-CM | POA: Insufficient documentation

## 2015-10-21 DIAGNOSIS — R1084 Generalized abdominal pain: Secondary | ICD-10-CM | POA: Diagnosis present

## 2015-10-21 DIAGNOSIS — M546 Pain in thoracic spine: Secondary | ICD-10-CM | POA: Diagnosis not present

## 2015-10-21 DIAGNOSIS — K529 Noninfective gastroenteritis and colitis, unspecified: Secondary | ICD-10-CM | POA: Diagnosis not present

## 2015-10-21 DIAGNOSIS — Z87440 Personal history of urinary (tract) infections: Secondary | ICD-10-CM | POA: Diagnosis not present

## 2015-10-21 DIAGNOSIS — R0789 Other chest pain: Secondary | ICD-10-CM | POA: Insufficient documentation

## 2015-10-21 LAB — CBC
HEMATOCRIT: 34.5 % — AB (ref 36.0–46.0)
Hemoglobin: 11 g/dL — ABNORMAL LOW (ref 12.0–15.0)
MCH: 23.8 pg — ABNORMAL LOW (ref 26.0–34.0)
MCHC: 31.9 g/dL (ref 30.0–36.0)
MCV: 74.7 fL — ABNORMAL LOW (ref 78.0–100.0)
PLATELETS: 213 10*3/uL (ref 150–400)
RBC: 4.62 MIL/uL (ref 3.87–5.11)
RDW: 15.7 % — ABNORMAL HIGH (ref 11.5–15.5)
WBC: 13.1 10*3/uL — ABNORMAL HIGH (ref 4.0–10.5)

## 2015-10-21 LAB — URINALYSIS, ROUTINE W REFLEX MICROSCOPIC
Bilirubin Urine: NEGATIVE
Glucose, UA: NEGATIVE mg/dL
HGB URINE DIPSTICK: NEGATIVE
Ketones, ur: 15 mg/dL — AB
Leukocytes, UA: NEGATIVE
Nitrite: NEGATIVE
Protein, ur: NEGATIVE mg/dL
SPECIFIC GRAVITY, URINE: 1.028 (ref 1.005–1.030)
pH: 6.5 (ref 5.0–8.0)

## 2015-10-21 LAB — BASIC METABOLIC PANEL
Anion gap: 11 (ref 5–15)
BUN: 10 mg/dL (ref 6–20)
CO2: 17 mmol/L — ABNORMAL LOW (ref 22–32)
CREATININE: 0.7 mg/dL (ref 0.44–1.00)
Calcium: 10.1 mg/dL (ref 8.9–10.3)
Chloride: 108 mmol/L (ref 101–111)
GFR calc Af Amer: >60 mL/min (ref >60)
Glucose, Bld: 141 mg/dL — ABNORMAL HIGH (ref 65–99)
Potassium: 3.6 mmol/L (ref 3.5–5.1)
Sodium: 136 mmol/L (ref 135–145)

## 2015-10-21 LAB — I-STAT TROPONIN, ED: Troponin i, poc: 0 ng/mL (ref 0.00–0.08)

## 2015-10-21 MED ORDER — ONDANSETRON 8 MG PO TBDP: 8.0000 mg | ORAL_TABLET | Freq: Three times a day (TID) | ORAL | Status: DC | PRN

## 2015-10-21 MED ORDER — SUCRALFATE 1 G PO TABS
1.0000 g | ORAL_TABLET | Freq: Once | ORAL | Status: AC
  Administered 2015-10-21: 1 g via ORAL
  Filled 2015-10-21: qty 1

## 2015-10-21 MED ORDER — ACETAMINOPHEN 325 MG PO TABS
650.0000 mg | ORAL_TABLET | Freq: Once | ORAL | Status: AC
  Administered 2015-10-21: 650 mg via ORAL
  Filled 2015-10-21: qty 2

## 2015-10-21 NOTE — ED Notes (Signed)
Pt stable, ambulatory, states understanding of discharge instructions 

## 2015-10-21 NOTE — Discharge Instructions (Signed)
We saw you in the ER for THE NAUSEA, VOMITING, BACK PAIN, ABDOMINAL PAIN. All the results in the ER are normal. We are not sure what is causing your symptoms - but we suspect that you have a gastroenteritis as the cause of  The nausea, vomiting and abdominal pain. The workup in the ER is not complete, and is limited to screening for life threatening and emergent conditions only, so please see a primary care doctor for further evaluation.  Please return to the ER if your symptoms worsen; you have increased pain, fevers, chills, inability to keep any medications down, confusion. Otherwise see the outpatient doctor as requested.   Back Pain, Adult Back pain is very common in adults.The cause of back pain is rarely dangerous and the pain often gets better over time.The cause of your back pain may not be known. Some common causes of back pain include:  Strain of the muscles or ligaments supporting the spine.  Wear and tear (degeneration) of the spinal disks.  Arthritis.  Direct injury to the back. For many people, back pain may return. Since back pain is rarely dangerous, most people can learn to manage this condition on their own. HOME CARE INSTRUCTIONS Watch your back pain for any changes. The following actions may help to lessen any discomfort you are feeling:  Remain active. It is stressful on your back to sit or stand in one place for long periods of time. Do not sit, drive, or stand in one place for more than 30 minutes at a time. Take short walks on even surfaces as soon as you are able.Try to increase the length of time you walk each day.  Exercise regularly as directed by your health care provider. Exercise helps your back heal faster. It also helps avoid future injury by keeping your muscles strong and flexible.  Do not stay in bed.Resting more than 1-2 days can delay your recovery.  Pay attention to your body when you bend and lift. The most comfortable positions are those that  put less stress on your recovering back. Always use proper lifting techniques, including:  Bending your knees.  Keeping the load close to your body.  Avoiding twisting.  Find a comfortable position to sleep. Use a firm mattress and lie on your side with your knees slightly bent. If you lie on your back, put a pillow under your knees.  Avoid feeling anxious or stressed.Stress increases muscle tension and can worsen back pain.It is important to recognize when you are anxious or stressed and learn ways to manage it, such as with exercise.  Take medicines only as directed by your health care provider. Over-the-counter medicines to reduce pain and inflammation are often the most helpful.Your health care provider may prescribe muscle relaxant drugs.These medicines help dull your pain so you can more quickly return to your normal activities and healthy exercise.  Apply ice to the injured area:  Put ice in a plastic bag.  Place a towel between your skin and the bag.  Leave the ice on for 20 minutes, 2-3 times a day for the first 2-3 days. After that, ice and heat may be alternated to reduce pain and spasms.  Maintain a healthy weight. Excess weight puts extra stress on your back and makes it difficult to maintain good posture. SEEK MEDICAL CARE IF:  You have pain that is not relieved with rest or medicine.  You have increasing pain going down into the legs or buttocks.  You have pain  that does not improve in one week.  You have night pain.  You lose weight.  You have a fever or chills. SEEK IMMEDIATE MEDICAL CARE IF:   You develop new bowel or bladder control problems.  You have unusual weakness or numbness in your arms or legs.  You develop nausea or vomiting.  You develop abdominal pain.  You feel faint.   This information is not intended to replace advice given to you by your health care provider. Make sure you discuss any questions you have with your health care  provider.   Document Released: 10/30/2005 Document Revised: 11/20/2014 Document Reviewed: 03/03/2014 Elsevier Interactive Patient Education 2016 [employer]. Viral Gastroenteritis Viral gastroenteritis is also known as stomach flu. This condition affects the stomach and intestinal tract. It can cause sudden diarrhea and vomiting. The illness typically lasts 3 to 8 days. Most people develop an immune response that eventually gets rid of the virus. While this natural response develops, the virus can make you quite ill. CAUSES  Many different viruses can cause gastroenteritis, such as rotavirus or noroviruses. You can catch one of these viruses by consuming contaminated food or water. You may also catch a virus by sharing utensils or other personal items with an infected person or by touching a contaminated surface. SYMPTOMS  The most common symptoms are diarrhea and vomiting. These problems can cause a severe loss of body fluids (dehydration) and a body salt (electrolyte) imbalance. Other symptoms may include:  Fever.  Headache.  Fatigue.  Abdominal pain. DIAGNOSIS  Your caregiver can usually diagnose viral gastroenteritis based on your symptoms and a physical exam. A stool sample may also be taken to test for the presence of viruses or other infections. TREATMENT  This illness typically goes away on its own. Treatments are aimed at rehydration. The most serious cases of viral gastroenteritis involve vomiting so severely that you are not able to keep fluids down. In these cases, fluids must be given through an intravenous line (IV). HOME CARE INSTRUCTIONS   Drink enough fluids to keep your urine clear or pale yellow. Drink small amounts of fluids frequently and increase the amounts as tolerated.  Ask your caregiver for specific rehydration instructions.  Avoid:  Foods high in sugar.  Alcohol.  Carbonated drinks.  Tobacco.  Juice.  Caffeine drinks.  Extremely hot or cold  fluids.  Fatty, greasy foods.  Too much intake of anything at one time.  Dairy products until 24 to 48 hours after diarrhea stops.  You may consume probiotics. Probiotics are active cultures of beneficial bacteria. They may lessen the amount and number of diarrheal stools in adults. Probiotics can be found in yogurt with active cultures and in supplements.  Wash your hands well to avoid spreading the virus.  Only take over-the-counter or prescription medicines for pain, discomfort, or fever as directed by your caregiver. Do not give aspirin to children. Antidiarrheal medicines are not recommended.  Ask your caregiver if you should continue to take your regular prescribed and over-the-counter medicines.  Keep all follow-up appointments as directed by your caregiver. SEEK IMMEDIATE MEDICAL CARE IF:   You are unable to keep fluids down.  You do not urinate at least once every 6 to 8 hours.  You develop shortness of breath.  You notice blood in your stool or vomit. This may look like coffee grounds.  You have abdominal pain that increases or is concentrated in one small area (localized).  You have persistent vomiting or diarrhea.  You have a fever.  The patient is a child younger than 3 months, and he or she has a fever.  The patient is a child older than 3 months, and he or she has a fever and persistent symptoms.  The patient is a child older than 3 months, and he or she has a fever and symptoms suddenly get worse.  The patient is a baby, and he or she has no tears when crying. MAKE SURE YOU:   Understand these instructions.  Will watch your condition.  Will get help right away if you are not doing well or get worse.   This information is not intended to replace advice given to you by your health care provider. Make sure you discuss any questions you have with your health care provider.   Document Released: 10/30/2005 Document Revised: 01/22/2012 Document Reviewed:  08/16/2011 Elsevier Interactive Patient Education 2016 [place] Liquid Diet A clear liquid diet is a short-term diet that is prescribed to provide the necessary fluid and basic energy you need when you can have nothing else. The clear liquid diet consists of liquids or solids that will become liquid at room temperature. You should be able to see through the liquid. There are many reasons that you may be restricted to clear liquids, such as:  When you have a sudden-onset (acute) condition that occurs before or after surgery.  To help your body slowly get adjusted to food again after a long period when you were unable to have food.  Replacement of fluids when you have a diarrheal disease.  When you are going to have certain exams, such as a colonoscopy, in which instruments are inserted inside your body to look at parts of your digestive system. WHAT CAN I HAVE? A clear liquid diet does not provide all the nutrients you need. It is important to choose a variety of the following items to get as many nutrients as possible:  Vegetable juices that do not have pulp.  Fruit juices and fruit drinks that do not have pulp.  Coffee (regular or decaffeinated), tea, or soda at the discretion of your health care provider.  Clear bouillon, broth, or strained broth-based soups.  High-protein and flavored gelatins.  Sugar or honey.  Ices or frozen ice pops that do not contain milk. If you are not sure whether you can have certain items, you should ask your health care provider. You may also ask your health care provider if there are any other clear liquid options.   This information is not intended to replace advice given to you by your health care provider. Make sure you discuss any questions you have with your health care provider.   Document Released: 10/30/2005 Document Revised: 11/04/2013 Document Reviewed: 09/26/2013 Elsevier Interactive Patient Education [employer].

## 2015-10-21 NOTE — ED Provider Notes (Signed)
CSN: [id number]     Arrival date & time 10/21/15  1755 History   First MD Initiated Contact with Patient 10/21/15 1949     Chief Complaint  Patient presents with  . Chest Pain     (Consider location/radiation/quality/duration/timing/severity/associated sxs/prior Treatment) HPI Comments: Pt comes in with cc of abd pain, n/v, chest pain. Pt has no significant medical hx and no abdominal surgeries. Reports that she had chinese food yday with her family, and while her daughter started having emesis last night, pt started having emesis today. She has had 5-10 episodes of emesis and diarrhea today, bilious, non bloody, no mucus. Pt also has generalized abd pain and some R sided chest discomfort by her breast and thoracic back pain. She also reports lower extremity pain. Denies any DIB. No fevers.   ROS 10 Systems reviewed and are negative for acute change except as noted in the HPI.     The history is provided by the patient.    Past Medical History  Diagnosis Date  . Urinary tract infection   . Abnormal Pap smear   . Infection     trich, chlamydia   Past Surgical History  Procedure Laterality Date  . Induced abortion    . Open reduction internal fixation (orif) distal radial fracture Left 12/12/2014    Procedure: OPEN REDUCTION INTERNAL FIXATION (ORIF) LEFT DISTAL RADIAL FRACTURE;  Surgeon: Roseanne Kaufman, MD;  Location: [facility];  Service: Orthopedics;  Laterality: Left;   Family History  Problem Relation Age of Onset  . Hypertension Mother   . Diabetes Father   . Other Neg Hx    Social History  Substance Use Topics  . Smoking status: Current Every Day Smoker -- 0.50 packs/day for 8 years    Types: Cigarettes  . Smokeless tobacco: Never Used  . Alcohol Use: No     Comment: socially   OB History    Gravida Para Term Preterm AB TAB SAB Ectopic Multiple Living   7 3 3  0 4 3 1  0 0 3     Review of Systems  Constitutional: Positive for activity change.  Respiratory:  Negative for shortness of breath.   Cardiovascular: Positive for chest pain.  Gastrointestinal: Positive for nausea, vomiting and diarrhea. Negative for abdominal pain.  Genitourinary: Negative for dysuria.  Musculoskeletal: Positive for back pain. Negative for neck pain.  Neurological: Negative for headaches.      Allergies  Review of patient's allergies indicates no known allergies.  Home Medications   Prior to Admission medications   Medication Sig Start Date End Date Taking? Authorizing Provider  ondansetron (ZOFRAN ODT) 8 MG disintegrating tablet Take 1 tablet (8 mg total) by mouth every 8 (eight) hours as needed for nausea. 10/21/15   Narcissa Melder Kathrynn Humble, MD   BP 129/96 mmHg  Pulse 91  Temp(Src) 98.6 F (37 C) (Oral)  Resp 20  Ht 5\' 6"  (1.676 m)  Wt 180 lb (81.647 kg)  BMI 29.07 kg/m2  SpO2 100%  LMP 09/21/2015 Physical Exam  Constitutional: She is oriented to person, place, and time. She appears well-developed and well-nourished.  HENT:  Head: Normocephalic and atraumatic.  Eyes: EOM are normal. Pupils are equal, round, and reactive to light.  Neck: Neck supple.  Cardiovascular: Normal rate, regular rhythm, normal heart sounds and intact distal pulses.   Pulmonary/Chest: Effort normal. No respiratory distress.  Abdominal: Soft. She exhibits no distension. There is tenderness. There is no rebound and no guarding.  Diffuse tenderness, no flank  tenderness  Musculoskeletal: She exhibits no edema or tenderness.  Thoracic back tendereness, diffuse  Neurological: She is alert and oriented to person, place, and time.  Skin: Skin is warm and dry.  Nursing note and vitals reviewed.   ED Course  Procedures (including critical care time) Labs Review Labs Reviewed  BASIC METABOLIC PANEL - Abnormal; Notable for the following:    CO2 17 (*)    Glucose, Bld 141 (*)    All other components within normal limits  CBC - Abnormal; Notable for the following:    WBC 13.1 (*)     Hemoglobin 11.0 (*)    HCT 34.5 (*)    MCV 74.7 (*)    MCH 23.8 (*)    RDW 15.7 (*)    All other components within normal limits  URINALYSIS, ROUTINE W REFLEX MICROSCOPIC (NOT AT [facility]) - Abnormal; Notable for the following:    Ketones, ur 15 (*)    All other components within normal limits  I-STAT TROPOININ, ED  POC URINE PREG, ED    Imaging Review Dg Chest 2 View  10/21/2015  CLINICAL DATA:  Right-sided chest pain with nausea and vomiting for 1 day. EXAM: CHEST  2 VIEW COMPARISON:  12/18/2011. FINDINGS: Trachea is midline. Heart size normal. Lungs are clear. No pleural fluid. IMPRESSION: No acute findings. Electronically Signed   By: Lorin Picket M.D.   On: 10/21/2015 19:10   I have personally reviewed and evaluated these images and lab results as part of my medical decision-making.   EKG Interpretation   Date/Time:  Thursday October 21 2015 18:03:16 EST Ventricular Rate:  97 PR Interval:  122 QRS Duration: 86 QT Interval:  322 QTC Calculation: 408 R Axis:   72 Text Interpretation:  Sinus rhythm with Premature atrial complexes  Otherwise normal ECG No significant change since last tracing Confirmed by  Dameian Crisman, MD, Thelma Comp [phone]) on 10/21/2015 8:05:17 PM      MDM   Final diagnoses:  Gastroenteritis  Bilateral thoracic back pain  Chest discomfort    Pt comes in w/ n/v/abd pain/chest discomfort/back pain and leg pain. Her GI symptoms appear to be separate than the other symptoms. She has no ACS risk factors, no dissection risk factors. No UTI like sx and no flank tenderness. She has no vaginal d/c or bleeding and denies any STD risk factors with the diffuse pain. We will get basic labs. Pt has not had emesis since she was roomed. She doesn't appear dehydrated. Will also ensure UA is clear.     Varney Biles, MD 10/21/15 2228

## 2015-10-21 NOTE — ED Notes (Signed)
Pt states she ate some chinese food last night and ever since has felt nauseous but states she started vomiting this am around 8 times. Pt states while vomiting this afternoon she started having a squeezing sensation in the central of her chest. Pt is warm and dry. Breathing is unlabored and lung sounds are clear.

## 2016-01-03 ENCOUNTER — Ambulatory Visit (INDEPENDENT_AMBULATORY_CARE_PROVIDER_SITE_OTHER): Payer: Medicaid Other | Admitting: Obstetrics

## 2016-01-03 ENCOUNTER — Encounter: Payer: Self-pay | Admitting: Obstetrics

## 2016-01-03 VITALS — BP 117/66 | HR 90 | Temp 98.2°F | Wt 201.0 lb

## 2016-01-03 DIAGNOSIS — A499 Bacterial infection, unspecified: Secondary | ICD-10-CM

## 2016-01-03 DIAGNOSIS — N760 Acute vaginitis: Secondary | ICD-10-CM

## 2016-01-03 DIAGNOSIS — N898 Other specified noninflammatory disorders of vagina: Secondary | ICD-10-CM | POA: Diagnosis not present

## 2016-01-03 DIAGNOSIS — R21 Rash and other nonspecific skin eruption: Secondary | ICD-10-CM | POA: Diagnosis not present

## 2016-01-03 DIAGNOSIS — B9689 Other specified bacterial agents as the cause of diseases classified elsewhere: Secondary | ICD-10-CM

## 2016-01-03 MED ORDER — PREDNISONE 10 MG (21) PO TBPK: ORAL_TABLET | ORAL | Status: DC

## 2016-01-03 MED ORDER — TINIDAZOLE 500 MG PO TABS: 1000.0000 mg | ORAL_TABLET | Freq: Every day | ORAL | Status: DC

## 2016-01-03 NOTE — Progress Notes (Signed)
Patient ID: Rebecca [name], female   DOB: May 07, 1986, 30 y.o.   MRN: [id number]  Chief Complaint  Patient presents with  . Vaginitis    discharge with odor    HPI Rebecca [name] is a 30 y.o. female.  Malodorous vaginal discharge.  Itchy rash over body.  HPI  Past Medical History  Diagnosis Date  . Urinary tract infection   . Abnormal Pap smear   . Infection     trich, chlamydia    Past Surgical History  Procedure Laterality Date  . Induced abortion    . Open reduction internal fixation (orif) distal radial fracture Left 12/12/2014    Procedure: OPEN REDUCTION INTERNAL FIXATION (ORIF) LEFT DISTAL RADIAL FRACTURE;  Surgeon: Roseanne Kaufman, MD;  Location: [facility];  Service: Orthopedics;  Laterality: Left;    Family History  Problem Relation Age of Onset  . Hypertension Mother   . Diabetes Father   . Other Neg Hx     Social History Social History  Substance Use Topics  . Smoking status: Current Every Day Smoker -- 0.50 packs/day for 8 years    Types: Cigarettes  . Smokeless tobacco: Never Used  . Alcohol Use: No    No Known Allergies  No current outpatient prescriptions on file.   No current facility-administered medications for this visit.    Review of Systems Review of Systems Constitutional: negative for fatigue and weight loss Respiratory: negative for cough and wheezing Cardiovascular: negative for chest pain, fatigue and palpitations Gastrointestinal: negative for abdominal pain and change in bowel habits Genitourinary: positive for malodorous vaginal discharge Integument/breast: positive for rash on arms, legs and back Musculoskeletal:negative for myalgias Neurological: negative for gait problems and tremors Behavioral/Psych: negative for abusive relationship, depression Endocrine: negative for temperature intolerance     Blood pressure 117/66, pulse 90, temperature 98.2 F (36.8 C), weight 201 lb (91.173 kg), last menstrual period 12/04/2015,  currently breastfeeding.  Physical Exam Physical Exam            General:  Alert and no distress Abdomen:  normal findings: no organomegaly, soft, non-tender and no hernia  Pelvis:  External genitalia: normal general appearance Urinary system: urethral meatus normal and bladder without fullness, nontender Vaginal: normal without tenderness, induration or masses.  Grey, thin discharge Cervix: normal appearance Adnexa: normal bimanual exam Uterus: anteverted and non-tender, normal size       Data Reviewed Labs  Assessment     BV Generalized rash     Plan    Tindamax Rx Prednisone DS Dose Pak Rx F/U 3 months for pap smear   Orders Placed This Encounter  Procedures  . SureSwab, Vaginosis/Vaginitis Plus   No orders of the defined types were placed in this encounter.

## 2016-01-07 LAB — SURESWAB, VAGINOSIS/VAGINITIS PLUS
ATOPOBIUM VAGINAE: 7.3 Log (cells/mL)
C. TROPICALIS, DNA: NOT DETECTED
C. albicans, DNA: NOT DETECTED
C. glabrata, DNA: NOT DETECTED
C. parapsilosis, DNA: NOT DETECTED
C. trachomatis RNA, TMA: NOT DETECTED
LACTOBACILLUS SPECIES: NOT DETECTED Log (cells/mL)
MEGASPHAERA SPECIES: 8 Log (cells/mL)
N. gonorrhoeae RNA, TMA: NOT DETECTED
T. vaginalis RNA, QL TMA: NOT DETECTED

## 2016-01-08 ENCOUNTER — Other Ambulatory Visit: Payer: Self-pay | Admitting: Obstetrics

## 2016-01-14 ENCOUNTER — Telehealth: Payer: Self-pay | Admitting: *Deleted

## 2016-01-14 NOTE — Telephone Encounter (Signed)
Attempt to contact pt regarding lab results and Tinidazole Rx.  Pt needs to be made aware of Rx approval.

## 2016-01-27 ENCOUNTER — Telehealth: Payer: Self-pay | Admitting: *Deleted

## 2016-01-27 NOTE — Telephone Encounter (Signed)
Pt called office regarding Rx. Return call to pt .  Pt made aware that her Rx, Tinidazole, had been approved through her insurance on 01-13-16. Pt made aware that the pharmacy was also made aware. Pt advised to contact pharmacy and have Rx processed through her insurance again to verify coverage. Pt advised to contact office if further problems with Rx.

## 2016-04-25 ENCOUNTER — Emergency Department (HOSPITAL_COMMUNITY)
Admission: EM | Admit: 2016-04-25 | Discharge: 2016-04-26 | Disposition: A | Discharge status: Home / Self Care | Payer: Medicaid Other | Attending: Dermatology | Admitting: Dermatology

## 2016-04-25 ENCOUNTER — Encounter (HOSPITAL_COMMUNITY): Payer: Self-pay | Admitting: Emergency Medicine

## 2016-04-25 DIAGNOSIS — S199XXA Unspecified injury of neck, initial encounter: Secondary | ICD-10-CM | POA: Diagnosis present

## 2016-04-25 DIAGNOSIS — Y939 Activity, unspecified: Secondary | ICD-10-CM | POA: Diagnosis not present

## 2016-04-25 DIAGNOSIS — Z5321 Procedure and treatment not carried out due to patient leaving prior to being seen by health care provider: Secondary | ICD-10-CM | POA: Insufficient documentation

## 2016-04-25 DIAGNOSIS — F17210 Nicotine dependence, cigarettes, uncomplicated: Secondary | ICD-10-CM | POA: Diagnosis not present

## 2016-04-25 DIAGNOSIS — S1093XA Contusion of unspecified part of neck, initial encounter: Secondary | ICD-10-CM | POA: Insufficient documentation

## 2016-04-25 DIAGNOSIS — Y929 Unspecified place or not applicable: Secondary | ICD-10-CM | POA: Insufficient documentation

## 2016-04-25 DIAGNOSIS — Y999 Unspecified external cause status: Secondary | ICD-10-CM | POA: Insufficient documentation

## 2016-04-25 NOTE — ED Notes (Signed)
Called for room, no answer
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
## 2016-04-26 NOTE — ED Notes (Signed)
Unable to locate patient when called for room

## 2018-07-29 ENCOUNTER — Other Ambulatory Visit: Payer: Self-pay

## 2018-07-29 ENCOUNTER — Encounter (HOSPITAL_COMMUNITY): Payer: Self-pay | Admitting: *Deleted

## 2018-07-29 ENCOUNTER — Emergency Department (HOSPITAL_COMMUNITY): Payer: No Typology Code available for payment source

## 2018-07-29 ENCOUNTER — Emergency Department (HOSPITAL_COMMUNITY)
Admission: EM | Admit: 2018-07-29 | Discharge: 2018-07-29 | Disposition: A | Discharge status: Home / Self Care | Payer: No Typology Code available for payment source | Attending: Emergency Medicine | Admitting: Emergency Medicine

## 2018-07-29 DIAGNOSIS — F17210 Nicotine dependence, cigarettes, uncomplicated: Secondary | ICD-10-CM | POA: Insufficient documentation

## 2018-07-29 DIAGNOSIS — Y9389 Activity, other specified: Secondary | ICD-10-CM | POA: Diagnosis not present

## 2018-07-29 DIAGNOSIS — Y92410 Unspecified street and highway as the place of occurrence of the external cause: Secondary | ICD-10-CM | POA: Diagnosis not present

## 2018-07-29 DIAGNOSIS — Y999 Unspecified external cause status: Secondary | ICD-10-CM | POA: Diagnosis not present

## 2018-07-29 DIAGNOSIS — S8991XA Unspecified injury of right lower leg, initial encounter: Secondary | ICD-10-CM | POA: Diagnosis present

## 2018-07-29 DIAGNOSIS — Z79899 Other long term (current) drug therapy: Secondary | ICD-10-CM | POA: Diagnosis not present

## 2018-07-29 DIAGNOSIS — S8001XA Contusion of right knee, initial encounter: Secondary | ICD-10-CM | POA: Insufficient documentation

## 2018-07-29 DIAGNOSIS — M25571 Pain in right ankle and joints of right foot: Secondary | ICD-10-CM | POA: Insufficient documentation

## 2018-07-29 MED ORDER — CYCLOBENZAPRINE HCL 10 MG PO TABS: 10.0000 mg | ORAL_TABLET | Freq: Two times a day (BID) | ORAL | 0 refills | Status: DC | PRN

## 2018-07-29 MED ORDER — IBUPROFEN 400 MG PO TABS
600.0000 mg | ORAL_TABLET | Freq: Once | ORAL | Status: AC
  Administered 2018-07-29: 600 mg via ORAL
  Filled 2018-07-29: qty 1
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
## 2018-07-29 NOTE — ED Triage Notes (Signed)
Pt also reports right knee pain worse with movement
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
## 2018-07-29 NOTE — ED Notes (Signed)
Pt verbalized understanding of discharge instructions and denies any further questions at this time.   
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
## 2018-11-25 ENCOUNTER — Inpatient Hospital Stay (HOSPITAL_COMMUNITY)
Admission: AD | Admit: 2018-11-25 | Discharge: 2018-11-25 | Disposition: A | Discharge status: Home / Self Care | Payer: Medicaid Other | Attending: Obstetrics and Gynecology | Admitting: Obstetrics and Gynecology

## 2018-11-25 ENCOUNTER — Other Ambulatory Visit (HOSPITAL_COMMUNITY)
Admission: RE | Admit: 2018-11-25 | Discharge: 2018-11-25 | Disposition: A | Discharge status: Home / Self Care | Payer: Medicaid Other | Source: Ambulatory Visit | Attending: Obstetrics and Gynecology | Admitting: Obstetrics and Gynecology

## 2018-11-25 ENCOUNTER — Ambulatory Visit (INDEPENDENT_AMBULATORY_CARE_PROVIDER_SITE_OTHER): Payer: Self-pay | Admitting: *Deleted

## 2018-11-25 DIAGNOSIS — N898 Other specified noninflammatory disorders of vagina: Secondary | ICD-10-CM | POA: Diagnosis present

## 2018-11-25 DIAGNOSIS — Z3202 Encounter for pregnancy test, result negative: Secondary | ICD-10-CM | POA: Insufficient documentation

## 2018-11-25 LAB — POCT PREGNANCY, URINE: Preg Test, Ur: NEGATIVE

## 2018-11-25 NOTE — MAU Note (Signed)
Urine in lab 

## 2018-11-25 NOTE — Progress Notes (Signed)
Pt reports having yellow vaginal discharge w/odor and slight itching x2 weeks. She requests testing for STI, yeast and BV. Self swab obtained.  Pt will be notified of results.

## 2018-11-25 NOTE — MAU Note (Signed)
Pt here for abdominal pain and vaginal discharge. Started before her period, still having the pain and discharge.

## 2018-11-27 LAB — CERVICOVAGINAL ANCILLARY ONLY
Bacterial vaginitis: POSITIVE — AB
CHLAMYDIA, DNA PROBE: NEGATIVE
Candida vaginitis: NEGATIVE
NEISSERIA GONORRHEA: NEGATIVE
TRICH (WINDOWPATH): NEGATIVE

## 2018-11-29 ENCOUNTER — Other Ambulatory Visit: Payer: Self-pay | Admitting: Obstetrics and Gynecology

## 2018-11-29 DIAGNOSIS — N760 Acute vaginitis: Principal | ICD-10-CM

## 2018-11-29 DIAGNOSIS — B9689 Other specified bacterial agents as the cause of diseases classified elsewhere: Secondary | ICD-10-CM

## 2018-11-29 MED ORDER — METRONIDAZOLE 500 MG PO TABS: 500.0000 mg | ORAL_TABLET | Freq: Two times a day (BID) | ORAL | 0 refills | Status: DC

## 2018-11-29 NOTE — Progress Notes (Signed)
Patient notified via My Chart  Laury Deep, CNM

## 2019-05-20 ENCOUNTER — Other Ambulatory Visit: Payer: Self-pay

## 2019-05-20 ENCOUNTER — Emergency Department (HOSPITAL_COMMUNITY)
Admission: EM | Admit: 2019-05-20 | Discharge: 2019-05-20 | Disposition: A | Discharge status: Home / Self Care | Payer: Self-pay | Attending: Emergency Medicine | Admitting: Emergency Medicine

## 2019-05-20 ENCOUNTER — Emergency Department (HOSPITAL_COMMUNITY): Payer: Self-pay

## 2019-05-20 ENCOUNTER — Encounter (HOSPITAL_COMMUNITY): Payer: Self-pay | Admitting: *Deleted

## 2019-05-20 DIAGNOSIS — M545 Low back pain, unspecified: Secondary | ICD-10-CM

## 2019-05-20 DIAGNOSIS — F17210 Nicotine dependence, cigarettes, uncomplicated: Secondary | ICD-10-CM | POA: Insufficient documentation

## 2019-05-20 DIAGNOSIS — N939 Abnormal uterine and vaginal bleeding, unspecified: Secondary | ICD-10-CM | POA: Insufficient documentation

## 2019-05-20 DIAGNOSIS — D509 Iron deficiency anemia, unspecified: Secondary | ICD-10-CM | POA: Insufficient documentation

## 2019-05-20 DIAGNOSIS — E876 Hypokalemia: Secondary | ICD-10-CM | POA: Insufficient documentation

## 2019-05-20 DIAGNOSIS — R102 Pelvic and perineal pain: Secondary | ICD-10-CM | POA: Insufficient documentation

## 2019-05-20 LAB — CBC WITH DIFFERENTIAL/PLATELET
Abs Immature Granulocytes: 0.01 10*3/uL (ref 0.00–0.07)
Basophils Absolute: 0 10*3/uL (ref 0.0–0.1)
Basophils Relative: 0 %
Eosinophils Absolute: 0 10*3/uL (ref 0.0–0.5)
Eosinophils Relative: 1 %
HCT: 30.1 % — ABNORMAL LOW (ref 36.0–46.0)
Hemoglobin: 8.5 g/dL — ABNORMAL LOW (ref 12.0–15.0)
Immature Granulocytes: 0 %
Lymphocytes Relative: 37 %
Lymphs Abs: 2 10*3/uL (ref 0.7–4.0)
MCH: 19 pg — ABNORMAL LOW (ref 26.0–34.0)
MCHC: 28.2 g/dL — ABNORMAL LOW (ref 30.0–36.0)
MCV: 67.2 fL — ABNORMAL LOW (ref 80.0–100.0)
Monocytes Absolute: 0.3 10*3/uL (ref 0.1–1.0)
Monocytes Relative: 6 %
Neutro Abs: 3 10*3/uL (ref 1.7–7.7)
Neutrophils Relative %: 56 %
Platelets: 245 10*3/uL (ref 150–400)
RBC: 4.48 MIL/uL (ref 3.87–5.11)
RDW: 18.6 % — ABNORMAL HIGH (ref 11.5–15.5)
WBC: 5.4 10*3/uL (ref 4.0–10.5)
nRBC: 0 % (ref 0.0–0.2)

## 2019-05-20 LAB — COMPREHENSIVE METABOLIC PANEL
ALT: 14 U/L (ref 0–44)
AST: 20 U/L (ref 15–41)
Albumin: 4.2 g/dL (ref 3.5–5.0)
Alkaline Phosphatase: 66 U/L (ref 38–126)
Anion gap: 7 (ref 5–15)
BUN: 12 mg/dL (ref 6–20)
CO2: 26 mmol/L (ref 22–32)
Calcium: 10.1 mg/dL (ref 8.9–10.3)
Chloride: 103 mmol/L (ref 98–111)
Creatinine, Ser: 0.78 mg/dL (ref 0.44–1.00)
GFR calc Af Amer: >60 mL/min (ref >60)
GFR calc non Af Amer: >60 mL/min (ref >60)
Glucose, Bld: 93 mg/dL (ref 70–99)
Potassium: 3.1 mmol/L — ABNORMAL LOW (ref 3.5–5.1)
Sodium: 136 mmol/L (ref 135–145)
Total Bilirubin: 0.2 mg/dL — ABNORMAL LOW (ref 0.3–1.2)
Total Protein: 7.8 g/dL (ref 6.5–8.1)

## 2019-05-20 LAB — URINALYSIS, ROUTINE W REFLEX MICROSCOPIC
Bacteria, UA: NONE SEEN
Bilirubin Urine: NEGATIVE
Glucose, UA: NEGATIVE mg/dL
Ketones, ur: NEGATIVE mg/dL
Leukocytes,Ua: NEGATIVE
Nitrite: NEGATIVE
Protein, ur: NEGATIVE mg/dL
Specific Gravity, Urine: 1.016 (ref 1.005–1.030)
pH: 6 (ref 5.0–8.0)

## 2019-05-20 LAB — POC URINE PREG, ED: Preg Test, Ur: NEGATIVE

## 2019-05-20 LAB — LIPASE, BLOOD: Lipase: 26 U/L (ref 11–51)

## 2019-05-20 LAB — WET PREP, GENITAL
Clue Cells Wet Prep HPF POC: NONE SEEN
Sperm: NONE SEEN
Trich, Wet Prep: NONE SEEN
WBC, Wet Prep HPF POC: NONE SEEN
Yeast Wet Prep HPF POC: NONE SEEN

## 2019-05-20 MED ORDER — IOHEXOL 300 MG/ML  SOLN
100.0000 mL | Freq: Once | INTRAMUSCULAR | Status: AC | PRN
  Administered 2019-05-20: 100 mL via INTRAVENOUS

## 2019-05-20 MED ORDER — POTASSIUM CHLORIDE CRYS ER 20 MEQ PO TBCR: 20.0000 meq | EXTENDED_RELEASE_TABLET | Freq: Two times a day (BID) | ORAL | 0 refills | Status: DC

## 2019-05-20 MED ORDER — KETOROLAC TROMETHAMINE 30 MG/ML IJ SOLN
30.0000 mg | Freq: Once | INTRAMUSCULAR | Status: AC
  Administered 2019-05-20: 30 mg via INTRAVENOUS
  Filled 2019-05-20: qty 1

## 2019-05-20 MED ORDER — CYCLOBENZAPRINE HCL 10 MG PO TABS: 10.0000 mg | ORAL_TABLET | Freq: Three times a day (TID) | ORAL | 0 refills | Status: DC | PRN

## 2019-05-20 MED ORDER — FERROUS SULFATE 325 (65 FE) MG PO TABS: 325.0000 mg | ORAL_TABLET | Freq: Every day | ORAL | 0 refills | Status: DC

## 2019-05-20 NOTE — Discharge Instructions (Signed)
Return in the morning for ultrasound of your uterus and ovaries.  Take ibuprofen or naproxen as needed for pain.

## 2019-05-20 NOTE — ED Provider Notes (Signed)
[facility] EMERGENCY DEPARTMENT Provider Note   CSN: [id number] Arrival date & time: 05/20/19  0011    History   Chief Complaint Chief Complaint  Patient presents with   Back Pain    HPI Rebecca [name] is a 33 y.o. female.   The history is provided by the patient.  Back Pain She has history of an abnormal Pap smear and comes in complaining of abnormal vaginal bleeding, right suprapubic pain, and right lower lumbar pain.  Vaginal bleeding started about 4 weeks ago and has been light.  She uses 2 pads a day.  There have been no clots passed.  She has noted a brownish discharge.  Pain is dull and comes and goes.  She started having right lumbar pain 4 days ago and that pain is been constant and sharp.  There is radiation to both thighs.  She denies any weakness, numbness, tingling.  She denies any bowel or bladder dysfunction.  She took acetaminophen and tramadol which did give temporary relief.  Back pain is better if she lays still, worse if she moves or if she sits in certain positions.  Last normal menses was in mid May and she states that her menses are usually very regular.  She states that she is currently sexually abstinent.  She did do a home pregnancy test which was negative.  Past Medical History:  Diagnosis Date   Abnormal Pap smear    Infection    trich, chlamydia   Urinary tract infection     Patient Active Problem List   Diagnosis Date Noted   Radius distal fracture 12/11/2014   NSVD (normal spontaneous vaginal delivery) 12/03/2014   Post-dates pregnancy 12/02/2014   Supervision of other normal pregnancy 06/17/2014   Smoker 10/29/2013    Past Surgical History:  Procedure Laterality Date   INDUCED ABORTION     OPEN REDUCTION INTERNAL FIXATION (ORIF) DISTAL RADIAL FRACTURE Left 12/12/2014   Procedure: OPEN REDUCTION INTERNAL FIXATION (ORIF) LEFT DISTAL RADIAL FRACTURE;  Surgeon: Roseanne Kaufman, MD;  Location: [place];  Service: Orthopedics;  Laterality:  Left;     OB History    Gravida  7   Para  3   Term  3   Preterm  0   AB  4   Living  3     SAB  1   TAB  3   Ectopic  0   Multiple  0   Live Births  3            Home Medications    Prior to Admission medications   Medication Sig Start Date End Date Taking? Authorizing Provider  cyclobenzaprine (FLEXERIL) 10 MG tablet Take 1 tablet (10 mg total) by mouth 2 (two) times daily as needed for muscle spasms. Patient not taking: Reported on 11/25/2018 07/29/18   McDonald, Mia A, PA-C  metroNIDAZOLE (FLAGYL) 500 MG tablet Take 1 tablet (500 mg total) by mouth 2 (two) times daily. 11/29/18   Laury Deep, CNM    Family History Family History  Problem Relation Age of Onset   Hypertension Mother    Diabetes Father    Other Neg Hx     Social History Social History   Tobacco Use   Smoking status: Current Every Day Smoker    Packs/day: 1.00    Years: 8.00    Pack years: 8.00    Types: Cigarettes   Smokeless tobacco: Never Used  Substance Use Topics   Alcohol use: No  Drug use: Yes    Frequency: 7.0 times per week    Types: Marijuana    Comment: on the weekends     Allergies   Patient has no known allergies.   Review of Systems Review of Systems  Musculoskeletal: Positive for back pain.  All other systems reviewed and are negative.    Physical Exam Updated Vital Signs BP 139/77    Pulse 77    Temp 97.8 F (36.6 C)    Resp 18    Ht 5\' 7"  (1.702 m)    Wt 83.9 kg    SpO2 100%    BMI 28.98 kg/m   Physical Exam Vitals signs and nursing note reviewed.    33 year old female, resting comfortably and in no acute distress. Vital signs are normal. Oxygen saturation is 100%, which is normal. Head is normocephalic and atraumatic. PERRLA, EOMI. Oropharynx is clear. Neck is nontender and supple without adenopathy or JVD. Back is mildly tender in the left lower paralumbar area.  There is no midline tenderness.  Straight leg raise is negative.   There is no CVA tenderness. Lungs are clear without rales, wheezes, or rhonchi. Chest is nontender. Heart has regular rate and rhythm without murmur. Abdomen is soft, flat, with mild right suprapubic tenderness.  There is no rebound or guarding.  There are no masses or hepatosplenomegaly and peristalsis is normoactive. Pelvic: Normal external female genitalia.  Cervix is open with small amount of blood coming through the cervix, but no pooling of blood in the vagina.  Fundus is enlarged to 6-8-week size, but there is no cervical motion tenderness and no tenderness on palpation of the fundus.  There is no adnexal masses or tenderness. Extremities have no cyanosis or edema, full range of motion is present. Skin is warm and dry without rash. Neurologic: Mental status is normal, cranial nerves are intact, there are no motor or sensory deficits.  ED Treatments / Results  Labs (all labs ordered are listed, but only abnormal results are displayed) Labs Reviewed  CBC WITH DIFFERENTIAL/PLATELET - Abnormal; Notable for the following components:      Result Value   Hemoglobin 8.5 (*)    HCT 30.1 (*)    MCV 67.2 (*)    MCH 19.0 (*)    MCHC 28.2 (*)    RDW 18.6 (*)    All other components within normal limits  COMPREHENSIVE METABOLIC PANEL - Abnormal; Notable for the following components:   Potassium 3.1 (*)    Total Bilirubin 0.2 (*)    All other components within normal limits  URINALYSIS, ROUTINE W REFLEX MICROSCOPIC - Abnormal; Notable for the following components:   Hgb urine dipstick LARGE (*)    All other components within normal limits  WET PREP, GENITAL  LIPASE, BLOOD  RPR  HIV ANTIBODY (ROUTINE TESTING W REFLEX)  POC URINE PREG, ED  GC/CHLAMYDIA PROBE AMP ([place]) NOT AT [facility]    Radiology Ct Abdomen Pelvis W Contrast  Result Date: 05/20/2019 CLINICAL DATA:  Lower abdominal pain. Low back pain. Patient reports vaginal bleeding for 1 month. EXAM: CT ABDOMEN AND PELVIS WITH  CONTRAST TECHNIQUE: Multidetector CT imaging of the abdomen and pelvis was performed using the standard protocol following bolus administration of intravenous contrast. CONTRAST:  135mL OMNIPAQUE IOHEXOL 300 MG/ML  SOLN COMPARISON:  None. FINDINGS: Lower chest: Lung bases are clear. Hepatobiliary: No focal liver abnormality is seen. No gallstones, gallbladder wall thickening, or biliary dilatation. Pancreas: No ductal dilatation or  inflammation. Spleen: Normal in size without focal abnormality. Adrenals/Urinary Tract: Normal adrenal glands. No hydronephrosis or perinephric edema. Homogeneous renal enhancement. Urinary bladder is partially distended without wall thickening. Stomach/Bowel: Stomach is distended with ingested contents. Appendix appears normal. No evidence of bowel wall thickening, distention, or inflammatory changes. No diverticular disease. Vascular/Lymphatic: Abdominal aorta is normal in caliber. Portal vein is patent. No adenopathy. Reproductive: 4.4 cm soft tissue density in the posterior left uterine fundus protrudes into the endometrial canal. Bilateral cystic structures in both adnexa, which may be septated or tubular, question hydrosalpinx. Other: No free air, free fluid, or intra-abdominal fluid collection. Musculoskeletal: There are no acute or suspicious osseous abnormalities. IMPRESSION: 1. Rounded 4.4 cm soft tissue density in the posterior left uterine fundus protrudes into the endometrial canal, likely uterine fibroid. 2. Cystic structures in both adnexa, which may represent hydrosalpinx versus ovarian cysts. 3. Recommend pelvic ultrasound for further characterization of above findings. Electronically Signed   By: Keith Rake M.D.   On: 05/20/2019 02:49    Procedures Procedures  Medications Ordered in ED Medications  ketorolac (TORADOL) 30 MG/ML injection 30 mg (30 mg Intravenous Given 05/20/19 0202)  iohexol (OMNIPAQUE) 300 MG/ML solution 100 mL (100 mLs Intravenous Contrast  Given 05/20/19 0230)     Initial Impression / Assessment and Plan / ED Course  I have reviewed the triage vital signs and the nursing notes.  Pertinent labs & imaging results that were available during my care of the patient were reviewed by me and considered in my medical decision making (see chart for details).  Suprapubic pain and abnormal vaginal bleeding, cause uncertain.  Consider ovarian cyst.  Will need to check pregnancy test.  Back pain appears to be musculoskeletal, possibly related to posture changes because of her ongoing suprapubic pain.  Old records are reviewed, and she has no relevant past visits, no prior imaging of lumbar spine.  Pelvic exam does not show obvious pathology other than mildly enlarged uterus.  Will send for CT of abdomen and pelvis.  CT scan is significant for probable uterine fibroid, possible of ovarian cysts versus hydrosalpinx.  Labs are significant for hypokalemia and microcytic hypochromic anemia.  Hemoglobin has dropped significantly from 2016.  She will need to be brought back for pelvic ultrasound to further evaluate the fibroid and ovarian cyst versus hydrosalpinx.  She is discharged with prescriptions for iron and cyclobenzaprine.  She is advised to use over-the-counter NSAIDs as needed.  Also given prescription for potassium.  She is referred to GYN for follow-up.  Final Clinical Impressions(s) / ED Diagnoses   Final diagnoses:  Pelvic pain in female  Abnormal vaginal bleeding  Hypokalemia  Microcytic hypochromic anemia  Acute right-sided low back pain without sciatica    ED Discharge Orders         Ordered    cyclobenzaprine (FLEXERIL) 10 MG tablet  3 times daily PRN     05/20/19 0409    US PELVIC COMPLETE WITH TRANSVAGINAL     05/20/19 0410    ferrous sulfate 325 (65 FE) MG tablet  Daily     05/20/19 0411    potassium chloride SA (K-DUR) 20 MEQ tablet  2 times daily     05/20/19 1856           Delora Fuel, MD 31/49/70 [phone]

## 2019-05-20 NOTE — ED Triage Notes (Signed)
Pt c/o lower back pain x 4 days and states she has been having vaginal bleeding x one month

## 2019-05-21 LAB — HIV ANTIBODY (ROUTINE TESTING W REFLEX): HIV Screen 4th Generation wRfx: NONREACTIVE

## 2019-05-21 LAB — RPR: RPR Ser Ql: NONREACTIVE

## 2019-05-21 LAB — GC/CHLAMYDIA PROBE AMP (~~LOC~~) NOT AT ARMC
Chlamydia: NEGATIVE
Neisseria Gonorrhea: NEGATIVE

## 2020-01-28 ENCOUNTER — Emergency Department (HOSPITAL_COMMUNITY)
Admission: EM | Admit: 2020-01-28 | Discharge: 2020-01-28 | Disposition: A | Discharge status: Home / Self Care | Payer: Medicaid Other | Attending: Emergency Medicine | Admitting: Emergency Medicine

## 2020-01-28 ENCOUNTER — Encounter (HOSPITAL_COMMUNITY): Payer: Self-pay | Admitting: Emergency Medicine

## 2020-01-28 ENCOUNTER — Emergency Department (HOSPITAL_COMMUNITY): Payer: Medicaid Other

## 2020-01-28 ENCOUNTER — Other Ambulatory Visit: Payer: Self-pay

## 2020-01-28 DIAGNOSIS — S39012A Strain of muscle, fascia and tendon of lower back, initial encounter: Secondary | ICD-10-CM | POA: Insufficient documentation

## 2020-01-28 DIAGNOSIS — F17210 Nicotine dependence, cigarettes, uncomplicated: Secondary | ICD-10-CM | POA: Diagnosis not present

## 2020-01-28 DIAGNOSIS — R1031 Right lower quadrant pain: Secondary | ICD-10-CM

## 2020-01-28 DIAGNOSIS — D219 Benign neoplasm of connective and other soft tissue, unspecified: Secondary | ICD-10-CM | POA: Insufficient documentation

## 2020-01-28 DIAGNOSIS — D509 Iron deficiency anemia, unspecified: Secondary | ICD-10-CM | POA: Insufficient documentation

## 2020-01-28 DIAGNOSIS — S3992XA Unspecified injury of lower back, initial encounter: Secondary | ICD-10-CM | POA: Diagnosis present

## 2020-01-28 DIAGNOSIS — N898 Other specified noninflammatory disorders of vagina: Secondary | ICD-10-CM | POA: Insufficient documentation

## 2020-01-28 DIAGNOSIS — R531 Weakness: Secondary | ICD-10-CM | POA: Diagnosis not present

## 2020-01-28 DIAGNOSIS — Y939 Activity, unspecified: Secondary | ICD-10-CM | POA: Diagnosis not present

## 2020-01-28 DIAGNOSIS — Y998 Other external cause status: Secondary | ICD-10-CM | POA: Diagnosis not present

## 2020-01-28 DIAGNOSIS — F1210 Cannabis abuse, uncomplicated: Secondary | ICD-10-CM | POA: Diagnosis not present

## 2020-01-28 DIAGNOSIS — Y929 Unspecified place or not applicable: Secondary | ICD-10-CM | POA: Diagnosis not present

## 2020-01-28 DIAGNOSIS — X58XXXA Exposure to other specified factors, initial encounter: Secondary | ICD-10-CM | POA: Diagnosis not present

## 2020-01-28 DIAGNOSIS — R102 Pelvic and perineal pain: Secondary | ICD-10-CM | POA: Diagnosis not present

## 2020-01-28 LAB — URINALYSIS, ROUTINE W REFLEX MICROSCOPIC
Bilirubin Urine: NEGATIVE
Glucose, UA: NEGATIVE mg/dL
Hgb urine dipstick: NEGATIVE
Ketones, ur: NEGATIVE mg/dL
Leukocytes,Ua: NEGATIVE
Nitrite: NEGATIVE
Protein, ur: NEGATIVE mg/dL
Specific Gravity, Urine: 1.01 (ref 1.005–1.030)
pH: 6 (ref 5.0–8.0)

## 2020-01-28 LAB — COMPREHENSIVE METABOLIC PANEL
ALT: 18 U/L (ref 0–44)
AST: 20 U/L (ref 15–41)
Albumin: 3.9 g/dL (ref 3.5–5.0)
Alkaline Phosphatase: 73 U/L (ref 38–126)
Anion gap: 13 (ref 5–15)
BUN: 10 mg/dL (ref 6–20)
CO2: 17 mmol/L — ABNORMAL LOW (ref 22–32)
Calcium: 10 mg/dL (ref 8.9–10.3)
Chloride: 108 mmol/L (ref 98–111)
Creatinine, Ser: 0.59 mg/dL (ref 0.44–1.00)
GFR calc Af Amer: >60 mL/min (ref >60)
GFR calc non Af Amer: >60 mL/min (ref >60)
Glucose, Bld: 91 mg/dL (ref 70–99)
Potassium: 4.2 mmol/L (ref 3.5–5.1)
Sodium: 138 mmol/L (ref 135–145)
Total Bilirubin: 0.3 mg/dL (ref 0.3–1.2)
Total Protein: 7.3 g/dL (ref 6.5–8.1)

## 2020-01-28 LAB — CBC
HCT: 29.6 % — ABNORMAL LOW (ref 36.0–46.0)
Hemoglobin: 7.9 g/dL — ABNORMAL LOW (ref 12.0–15.0)
MCH: 18.3 pg — ABNORMAL LOW (ref 26.0–34.0)
MCHC: 26.7 g/dL — ABNORMAL LOW (ref 30.0–36.0)
MCV: 68.5 fL — ABNORMAL LOW (ref 80.0–100.0)
Platelets: 248 10*3/uL (ref 150–400)
RBC: 4.32 MIL/uL (ref 3.87–5.11)
RDW: 18.4 % — ABNORMAL HIGH (ref 11.5–15.5)
WBC: 6.7 10*3/uL (ref 4.0–10.5)
nRBC: 0 % (ref 0.0–0.2)

## 2020-01-28 LAB — I-STAT BETA HCG BLOOD, ED (MC, WL, AP ONLY): I-stat hCG, quantitative: <5 m[IU]/mL (ref <5)

## 2020-01-28 LAB — WET PREP, GENITAL
Clue Cells Wet Prep HPF POC: NONE SEEN
Sperm: NONE SEEN
Trich, Wet Prep: NONE SEEN
Yeast Wet Prep HPF POC: NONE SEEN

## 2020-01-28 LAB — LIPASE, BLOOD: Lipase: 22 U/L (ref 11–51)

## 2020-01-28 LAB — HIV ANTIBODY (ROUTINE TESTING W REFLEX): HIV Screen 4th Generation wRfx: NONREACTIVE

## 2020-01-28 MED ORDER — ONDANSETRON HCL 4 MG/2ML IJ SOLN
4.0000 mg | Freq: Once | INTRAMUSCULAR | Status: AC
  Administered 2020-01-28: 4 mg via INTRAVENOUS
  Filled 2020-01-28: qty 2

## 2020-01-28 MED ORDER — SODIUM CHLORIDE 0.9 % IV BOLUS
1000.0000 mL | Freq: Once | INTRAVENOUS | Status: AC
  Administered 2020-01-28: 1000 mL via INTRAVENOUS

## 2020-01-28 MED ORDER — MORPHINE SULFATE (PF) 4 MG/ML IV SOLN
4.0000 mg | Freq: Once | INTRAVENOUS | Status: AC
  Administered 2020-01-28: 4 mg via INTRAVENOUS
  Filled 2020-01-28: qty 1

## 2020-01-28 MED ORDER — METHOCARBAMOL 500 MG PO TABS: 500.0000 mg | ORAL_TABLET | Freq: Two times a day (BID) | ORAL | 0 refills | Status: DC

## 2020-01-28 MED ORDER — SODIUM CHLORIDE 0.9% FLUSH
3.0000 mL | Freq: Once | INTRAVENOUS | Status: AC
  Administered 2020-01-28: 3 mL via INTRAVENOUS
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
## 2020-01-28 NOTE — ED Triage Notes (Signed)
C/o lower back pain and RLQ pain x 3-4 days.  Denies nausea, vomiting, diarrhea, and urinary complaints.

## 2020-01-28 NOTE — Discharge Instructions (Signed)
Please keep your appointment with your OB/GYN.  Please discuss with them the results of your ultrasound including the fibroids.  Please discuss with them your anemia as well.  Please take an iron-containing vitamin and eat iron rich foods as we have discussed.  Please return to ED if you have any new or concerning symptoms such as passing out or worsening weakness and fatigue.

## 2020-01-29 LAB — GC/CHLAMYDIA PROBE AMP (~~LOC~~) NOT AT ARMC
Chlamydia: NEGATIVE
Neisseria Gonorrhea: NEGATIVE

## 2020-01-29 LAB — RPR: RPR Ser Ql: NONREACTIVE

## 2020-02-10 ENCOUNTER — Ambulatory Visit: Payer: Medicaid Other | Admitting: Obstetrics

## 2020-02-18 ENCOUNTER — Telehealth: Payer: Self-pay | Admitting: *Deleted

## 2020-02-18 NOTE — Telephone Encounter (Signed)
Pt called to office for BV Rx. Attempt to return call, no answer no VM.

## 2020-02-24 ENCOUNTER — Telehealth: Payer: Self-pay

## 2020-02-24 NOTE — Telephone Encounter (Signed)
Returned call, no answer, unable to leave vm °

## 2020-04-13 ENCOUNTER — Other Ambulatory Visit: Payer: Self-pay

## 2020-04-13 DIAGNOSIS — N898 Other specified noninflammatory disorders of vagina: Secondary | ICD-10-CM

## 2020-04-13 MED ORDER — METRONIDAZOLE 500 MG PO TABS: 500.0000 mg | ORAL_TABLET | Freq: Two times a day (BID) | ORAL | 0 refills | Status: DC

## 2020-04-13 NOTE — Progress Notes (Signed)
Rx sent per protocol for BV.

## 2021-03-01 ENCOUNTER — Ambulatory Visit (INDEPENDENT_AMBULATORY_CARE_PROVIDER_SITE_OTHER): Payer: Medicaid Other

## 2021-03-01 ENCOUNTER — Other Ambulatory Visit (HOSPITAL_COMMUNITY)
Admission: RE | Admit: 2021-03-01 | Discharge: 2021-03-01 | Disposition: A | Discharge status: Home / Self Care | Payer: Medicaid Other | Source: Ambulatory Visit

## 2021-03-01 ENCOUNTER — Other Ambulatory Visit: Payer: Self-pay

## 2021-03-01 VITALS — BP 150/90 | HR 71 | Wt 184.0 lb

## 2021-03-01 DIAGNOSIS — L739 Follicular disorder, unspecified: Secondary | ICD-10-CM | POA: Diagnosis not present

## 2021-03-01 DIAGNOSIS — N898 Other specified noninflammatory disorders of vagina: Secondary | ICD-10-CM | POA: Diagnosis present

## 2021-03-01 DIAGNOSIS — K649 Unspecified hemorrhoids: Secondary | ICD-10-CM

## 2021-03-01 DIAGNOSIS — L732 Hidradenitis suppurativa: Secondary | ICD-10-CM

## 2021-03-01 DIAGNOSIS — R030 Elevated blood-pressure reading, without diagnosis of hypertension: Secondary | ICD-10-CM | POA: Diagnosis not present

## 2021-03-01 NOTE — Progress Notes (Signed)
RGYN patient presents for problem visit today.  CC : Vaginal Discharge. Notes odor also.  Pt wants full panel on swab. Patient needs Rx for Hemorrhoids.    *LMP:  02/14/21.  *Repeated B/P: 150/90

## 2021-03-01 NOTE — Patient Instructions (Addendum)
Folliculitis  Folliculitis is inflammation of the hair follicles. Folliculitis most commonly occurs on the scalp, thighs, legs, back, and buttocks. However, it can occur anywhere on the body. What are the causes? This condition may be caused by:  A bacterial infection (common).  A fungal infection.  A viral infection.  Contact with certain chemicals, especially oils and tars.  Shaving or waxing.  Greasy ointments or creams applied to the skin. Long-lasting folliculitis and folliculitis that keeps coming back may be caused by bacteria. This bacteria can live anywhere on your skin and is often found in the nostrils. What increases the risk? You are more likely to develop this condition if you have:  A weakened immune system.  Diabetes.  Obesity. What are the signs or symptoms? Symptoms of this condition include:  Redness.  Soreness.  Swelling.  Itching.  Small white or yellow, pus-filled, itchy spots (pustules) that appear over a reddened area. If there is an infection that goes deep into the follicle, these may develop into a boil (furuncle).  A group of closely packed boils (carbuncle). These tend to form in hairy, sweaty areas of the body. How is this diagnosed? This condition is diagnosed with a skin exam. To find what is causing the condition, your health care provider may take a sample of one of the pustules or boils for testing in a lab. How is this treated? This condition may be treated by:  Applying warm compresses to the affected areas.  Taking an antibiotic medicine or applying an antibiotic medicine to the skin.  Applying or bathing with an antiseptic solution.  Taking an over-the-counter medicine to help with itching.  Having a procedure to drain any pustules or boils. This may be done if a pustule or boil contains a lot of pus or fluid.  Having laser hair removal. This may be done to treat long-lasting folliculitis. Follow these instructions at  home: Managing pain and swelling  If directed, apply heat to the affected area as often as told by your health care provider. Use the heat source that your health care provider recommends, such as a moist heat pack or a heating pad. ? Place a towel between your skin and the heat source. ? Leave the heat on for 20-30 minutes. ? Remove the heat if your skin turns bright red. This is especially important if you are unable to feel pain, heat, or cold. You may have a greater risk of getting burned.   General instructions  If you were prescribed an antibiotic medicine, take it or apply it as told by your health care provider. Do not stop using the antibiotic even if your condition improves.  Check the irritated area every day for signs of infection. Check for: ? Redness, swelling, or pain. ? Fluid or blood. ? Warmth. ? Pus or a bad smell.  Do not shave irritated skin.  Take over-the-counter and prescription medicines only as told by your health care provider.  Keep all follow-up visits as told by your health care provider. This is important. Get help right away if:  You have more redness, swelling, or pain in the affected area.  Red streaks are spreading from the affected area.  You have a fever. Summary  Folliculitis is inflammation of the hair follicles. Folliculitis most commonly occurs on the scalp, thighs, legs, back, and buttocks.  This condition may be treated by taking an antibiotic medicine or applying an antibiotic medicine to the skin, and applying or bathing with an  antiseptic solution.  If you were prescribed an antibiotic medicine, take it or apply it as told by your health care provider. Do not stop using the antibiotic even if your condition improves.  Get help right away if you have new or worsening symptoms.  Keep all follow-up visits as told by your health care provider. This is important. This information is not intended to replace advice given to you by your  health care provider. Make sure you discuss any questions you have with your health care provider. Document Revised: 06/08/2018 Document Reviewed: 06/08/2018 Elsevier Patient Education  2021 [place].  Hidradenitis Suppurativa Hidradenitis suppurativa is a long-term (chronic) skin disease. It is similar to a severe form of acne, but it affects areas of the body where acne would be unusual, especially areas of the body where skin rubs against skin and becomes moist. These include:  Underarms.  Groin.  Genital area.  Buttocks.  Upper thighs.  Breasts. Hidradenitis suppurativa may start out as small lumps or pimples caused by blocked sweat glands or hair follicles. Pimples may develop into deep sores that break open (rupture) and drain pus. Over time, affected areas of skin may thicken and become scarred. This condition is rare and does not spread from person to person (non-contagious). What are the causes? The exact cause of this condition is not known. It may be related to:  Female and female hormones.  An overactive disease-fighting system (immune system). The immune system may over-react to blocked hair follicles or sweat glands and cause swelling and pus-filled sores. What increases the risk? You are more likely to develop this condition if you:  Are female.  Are 26-55 years old.  Have a family history of hidradenitis suppurativa.  Have a personal history of acne.  Are overweight.  Smoke.  Take the medicine lithium. What are the signs or symptoms? The first symptoms are usually painful bumps in the skin, similar to pimples. The condition may get worse over time (progress), or it may only cause mild symptoms. If the disease progresses, symptoms may include:  Skin bumps getting bigger and growing deeper into the skin.  Bumps rupturing and draining pus.  Itchy, infected skin.  Skin getting thicker and scarred.  Tunnels under the skin (fistulas) where pus drains  from a bump.  Pain during daily activities, such as pain during walking if your groin area is affected.  Emotional problems, such as stress or depression. This condition may affect your appearance and your ability or willingness to wear certain clothes or do certain activities. How is this diagnosed? This condition is diagnosed by a health care provider who specializes in skin diseases (dermatologist). You may be diagnosed based on:  Your symptoms and medical history.  A physical exam.  Testing a pus sample for infection.  Blood tests. How is this treated? Your treatment will depend on how severe your symptoms are. The same treatment will not work for everybody with this condition. You may need to try several treatments to find what works best for you. Treatment may include:  Cleaning and bandaging (dressing) your wounds as needed.  Lifestyle changes, such as new skin care routines.  Taking medicines, such as: ? Antibiotics. ? Acne medicines. ? Medicines to reduce the activity of the immune system. ? A diabetes medicine (metformin). ? Birth control pills, for women. ? Steroids to reduce swelling and pain.  Working with a mental health care provider, if you experience emotional distress due to this condition. If you  have severe symptoms that do not get better with medicine, you may need surgery. Surgery may involve:  Using a laser to clear the skin and remove hair follicles.  Opening and draining deep sores.  Removing the areas of skin that are diseased and scarred. Follow these instructions at home: Medicines  Take over-the-counter and prescription medicines only as told by your health care provider.  If you were prescribed an antibiotic medicine, take it as told by your health care provider. Do not stop taking the antibiotic even if your condition improves.   Skin care  If you have open wounds, cover them with a clean dressing as told by your health care provider. Keep  wounds clean by washing them gently with soap and water when you bathe.  Do not shave the areas where you get hidradenitis suppurativa.  Do not wear deodorant.  Wear loose-fitting clothes.  Try to avoid getting overheated or sweaty. If you get sweaty or wet, change into clean, dry clothes as soon as you can.  To help relieve pain and itchiness, cover sore areas with a warm, clean washcloth (warm compress) for 5-10 minutes as often as needed.  If told by your health care provider, take a bleach bath twice a week: ? Fill your bathtub halfway with water. ? Pour in  cup of unscented household bleach. ? Soak in the tub for 5-10 minutes. ? Only soak from the neck down. Avoid water on your face and hair. ? Shower to rinse off the bleach from your skin. General instructions  Learn as much as you can about your disease so that you have an active role in your treatment. Work closely with your health care provider to find treatments that work for you.  If you are overweight, work with your health care provider to lose weight as recommended.  Do not use any products that contain nicotine or tobacco, such as cigarettes and e-cigarettes. If you need help quitting, ask your health care provider.  If you struggle with living with this condition, talk with your health care provider or work with a mental health care provider as recommended.  Keep all follow-up visits as told by your health care provider. This is important. Where to find more information  Hidradenitis [place].: https://www.hs-foundation.org/  American Academy of Dermatology: http://www.nguyen-hutchinson.com/ Contact a health care provider if you have:  A flare-up of hidradenitis suppurativa.  A fever or chills.  Trouble controlling your symptoms at home.  Trouble doing your daily activities because of your symptoms.  Trouble dealing with emotional problems related to your condition. Summary  Hidradenitis  suppurativa is a long-term (chronic) skin disease. It is similar to a severe form of acne, but it affects areas of the body where acne would be unusual.  The first symptoms are usually painful bumps in the skin, similar to pimples. The condition may only cause mild symptoms, or it may get worse over time (progress).  If you have open wounds, cover them with a clean dressing as told by your health care provider. Keep wounds clean by washing them gently with soap and water when you bathe.  Besides skin care, treatment may include medicines, laser treatment, and surgery. This information is not intended to replace advice given to you by your health care provider. Make sure you discuss any questions you have with your health care provider. Document Revised: 08/24/2020 Document Reviewed: 08/24/2020 Elsevier Patient Education  2021 [employer].

## 2021-03-01 NOTE — Progress Notes (Signed)
GYNECOLOGY PROBLEM OFFICE VISIT NOTE  History:  Rebecca [name] is a 35 y.o. [phone] here today for "feeling like I have BV coming on."  She states she has been having odor and increased discharge since after her last menstrual cycle.   She states the odor is not bad, just not her "normal."  She states her symptoms have been ongoing for about a week.  She denies pelvic pain or pain/discomfort during sexual activity.   Patient denies pain or difficulty with urination as well as issues with constipation or diarrhea.  She states she has intermittent constipation when taking iron pills. She also expresses concern about "breaking out" in her bikini area.  She states it has been an ongoing issue and would like to see a dermatologist regarding treatment.   She also reports hemorrhoids "since I had kids and I want them off."  She states "they have never gone away."  Patient states she would like to have them removed and would like to see someone about having them removed. Patient denies issues with rectal bleeding or pain.   Past Medical History:  Diagnosis Date  . Abnormal Pap smear   . Infection    trich, chlamydia  . Urinary tract infection     Past Surgical History:  Procedure Laterality Date  . INDUCED ABORTION    . OPEN REDUCTION INTERNAL FIXATION (ORIF) DISTAL RADIAL FRACTURE Left 12/12/2014   Procedure: OPEN REDUCTION INTERNAL FIXATION (ORIF) LEFT DISTAL RADIAL FRACTURE;  Surgeon: Roseanne Kaufman, MD;  Location: [facility];  Service: Orthopedics;  Laterality: Left;    The following portions of the patient's history were reviewed and updated as appropriate: allergies, current medications, past family history, past medical history, past social history, past surgical history and problem list.   Health Maintenance:  Normal pap and negative HRHPV on 2015.  No mammogram d/t age.   Review of Systems:  Genito-Urinary ROS: negative Gastrointestinal ROS: no abdominal pain, change in bowel habits,  or black or bloody stools Objective:  Vitals: BP (!) 149/94   Pulse 74   Wt 184 lb (83.5 kg)   LMP 02/14/2021 (Approximate)   BMI 29.70 kg/m   Physical Exam: Physical Exam Constitutional:      Appearance: Normal appearance.  HENT:     Head: Normocephalic and atraumatic.  Cardiovascular:     Rate and Rhythm: Normal rate.  Pulmonary:     Effort: Pulmonary effort is normal. No respiratory distress.  Musculoskeletal:     Cervical back: Normal range of motion.  Neurological:     Mental Status: She is alert and oriented to person, place, and time.  Skin:    General: Skin is warm and dry.     Findings: Acne present.     Comments: Acne and skin changes noted in rectal/gluteal area. No tenderness.   Psychiatric:        Mood and Affect: Mood normal.        Thought Content: Thought content normal.      Labs and Imaging: No results found.  Assessment & Plan:  34 year old Vaginal Discharge  Hemorrhoids-Chronic Folliculitis vs Hidradenitis Suppurativa Elevated BP   -CV collected. -Informed that exam not highly suggestive of BV and will await results before treating. -Informed that chronic hemorrhoids are not uncommon and treatment is usually not recommended unless complications are noted. -However, patient requests referral and will be sent to gastroenterology for referral to proctologist as appropriate. -Patient also informed that skin rash is suggestive  of folliculitis vs HS, but both would be mild cases.  Informed that provider does not know of any known treatment for these disorders as management is based on avoiding irritating factors such as shaving.  -However, will send referral for dermatologist. -Informed of elevated blood pressure and need to follow up with PCP. -RTO in 3-6 months for annual visit with pap smear.    Total face-to-face time with patient: 15 minutes   Gavin Pound, [place] 03/01/2021 2:42 PM

## 2021-03-02 LAB — CERVICOVAGINAL ANCILLARY ONLY
Bacterial Vaginitis (gardnerella): POSITIVE — AB
Candida Glabrata: NEGATIVE
Candida Vaginitis: NEGATIVE
Chlamydia: NEGATIVE
Comment: NEGATIVE
Comment: NEGATIVE
Comment: NEGATIVE
Comment: NEGATIVE
Comment: NEGATIVE
Comment: NORMAL
Neisseria Gonorrhea: NEGATIVE
Trichomonas: NEGATIVE

## 2021-05-31 ENCOUNTER — Ambulatory Visit (INDEPENDENT_AMBULATORY_CARE_PROVIDER_SITE_OTHER): Payer: Medicaid Other

## 2021-05-31 ENCOUNTER — Other Ambulatory Visit: Payer: Self-pay

## 2021-05-31 VITALS — BP 113/73 | HR 75 | Ht 66.0 in | Wt 182.0 lb

## 2021-05-31 DIAGNOSIS — Z3201 Encounter for pregnancy test, result positive: Secondary | ICD-10-CM | POA: Diagnosis not present

## 2021-05-31 LAB — POCT URINE PREGNANCY: Preg Test, Ur: POSITIVE — AB

## 2021-05-31 NOTE — Progress Notes (Signed)
Ms. Santee presents today for UPT. She has no unusual complaints.  LMP:04/07/2021    OBJECTIVE: Appears well, in no apparent distress.  OB History     Gravida  8   Para  3   Term  3   Preterm  0   AB  4   Living  3      SAB  1   IAB  3   Ectopic  0   Multiple  0   Live Births  3          Home UPT Result:POSITIVE In-Office UPT result:POSITIVE  I have reviewed the patient's medical, obstetrical, social, and family histories, and medications.   ASSESSMENT: Positive pregnancy test LMP  04/07/2021 EDD  01/12/2022 GA     [redacted]w[redacted]d  PLAN Prenatal care to be completed at: [facility]

## 2021-05-31 NOTE — Progress Notes (Signed)
Patient was assessed and managed by nursing staff during this encounter. I have reviewed the chart and agree with the documentation and plan. I have also made any necessary editorial changes.  Griffin Basil, MD 05/31/2021 3:02 PM

## 2021-06-13 ENCOUNTER — Telehealth: Payer: Self-pay

## 2021-06-13 ENCOUNTER — Inpatient Hospital Stay (HOSPITAL_COMMUNITY): Payer: Medicaid Other

## 2021-06-13 ENCOUNTER — Other Ambulatory Visit: Payer: Self-pay

## 2021-06-13 ENCOUNTER — Inpatient Hospital Stay (HOSPITAL_COMMUNITY)
Admission: AD | Admit: 2021-06-13 | Discharge: 2021-06-13 | Disposition: A | Discharge status: Home / Self Care | Payer: Medicaid Other | Attending: Obstetrics and Gynecology | Admitting: Obstetrics and Gynecology

## 2021-06-13 ENCOUNTER — Encounter (HOSPITAL_COMMUNITY): Payer: Self-pay | Admitting: Obstetrics and Gynecology

## 2021-06-13 DIAGNOSIS — Z3A09 9 weeks gestation of pregnancy: Secondary | ICD-10-CM | POA: Insufficient documentation

## 2021-06-13 DIAGNOSIS — O3411 Maternal care for benign tumor of corpus uteri, first trimester: Secondary | ICD-10-CM | POA: Diagnosis not present

## 2021-06-13 DIAGNOSIS — O99321 Drug use complicating pregnancy, first trimester: Secondary | ICD-10-CM | POA: Insufficient documentation

## 2021-06-13 DIAGNOSIS — O209 Hemorrhage in early pregnancy, unspecified: Secondary | ICD-10-CM | POA: Diagnosis present

## 2021-06-13 DIAGNOSIS — O99331 Smoking (tobacco) complicating pregnancy, first trimester: Secondary | ICD-10-CM | POA: Diagnosis not present

## 2021-06-13 DIAGNOSIS — O4691 Antepartum hemorrhage, unspecified, first trimester: Secondary | ICD-10-CM | POA: Diagnosis not present

## 2021-06-13 DIAGNOSIS — O360110 Maternal care for anti-D [Rh] antibodies, first trimester, not applicable or unspecified: Secondary | ICD-10-CM | POA: Diagnosis not present

## 2021-06-13 DIAGNOSIS — O0281 Inappropriate change in quantitative human chorionic gonadotropin (hCG) in early pregnancy: Secondary | ICD-10-CM

## 2021-06-13 DIAGNOSIS — F1290 Cannabis use, unspecified, uncomplicated: Secondary | ICD-10-CM | POA: Diagnosis not present

## 2021-06-13 DIAGNOSIS — D259 Leiomyoma of uterus, unspecified: Secondary | ICD-10-CM | POA: Diagnosis not present

## 2021-06-13 LAB — HCG, QUANTITATIVE, PREGNANCY: hCG, Beta Chain, Quant, S: 10520 m[IU]/mL — ABNORMAL HIGH (ref <5)

## 2021-06-13 LAB — URINALYSIS, ROUTINE W REFLEX MICROSCOPIC
Bilirubin Urine: NEGATIVE
Glucose, UA: NEGATIVE mg/dL
Ketones, ur: NEGATIVE mg/dL
Leukocytes,Ua: NEGATIVE
Nitrite: NEGATIVE
Protein, ur: NEGATIVE mg/dL
Specific Gravity, Urine: 1.01 (ref 1.005–1.030)
pH: 6 (ref 5.0–8.0)

## 2021-06-13 LAB — WET PREP, GENITAL
Clue Cells Wet Prep HPF POC: NONE SEEN
Sperm: NONE SEEN
Trich, Wet Prep: NONE SEEN
Yeast Wet Prep HPF POC: NONE SEEN

## 2021-06-13 LAB — CBC
HCT: 35 % — ABNORMAL LOW (ref 36.0–46.0)
Hemoglobin: 11.2 g/dL — ABNORMAL LOW (ref 12.0–15.0)
MCH: 24.9 pg — ABNORMAL LOW (ref 26.0–34.0)
MCHC: 32 g/dL (ref 30.0–36.0)
MCV: 77.8 fL — ABNORMAL LOW (ref 80.0–100.0)
Platelets: 241 10*3/uL (ref 150–400)
RBC: 4.5 MIL/uL (ref 3.87–5.11)
RDW: 17.1 % — ABNORMAL HIGH (ref 11.5–15.5)
WBC: 5.9 10*3/uL (ref 4.0–10.5)
nRBC: 0 % (ref 0.0–0.2)

## 2021-06-13 LAB — URINALYSIS, MICROSCOPIC (REFLEX)
Bacteria, UA: NONE SEEN
RBC / HPF: >50 RBC/hpf (ref 0–5)
Squamous Epithelial / HPF: NONE SEEN (ref 0–5)
WBC, UA: NONE SEEN WBC/hpf (ref 0–5)

## 2021-06-13 MED ORDER — RHO D IMMUNE GLOBULIN 1500 UNIT/2ML IJ SOSY
300.0000 ug | PREFILLED_SYRINGE | Freq: Once | INTRAMUSCULAR | Status: AC
  Administered 2021-06-13: 300 ug via INTRAMUSCULAR
  Filled 2021-06-13: qty 2

## 2021-06-13 NOTE — ED Triage Notes (Signed)
Pt concerned for a possible miscarriage. Began spotting Saturday morning which resolved, then Sunday evening noticed brown color when she wiped after using the restroom. Pt woke this am with vaginal bleeding and some clotting. Positive pregnancy test 7.5 weeks ago. Abd cramping

## 2021-06-13 NOTE — Telephone Encounter (Signed)
TC from patient, she reported some bleeding yesterday and this morning she has noticed chucks of blood coming out and thinks she may be having a miscarriage.  I have advised her to go directly to the [facility] Children unit for follow up care.Patient voice concerns and started to cry. I told patient I was sorry she was bleeding, but she needs to f/up with [facility] Children unit.

## 2021-06-13 NOTE — ED Provider Notes (Signed)
Emergency Medicine Provider Triage Evaluation Note  Rebecca [name] , a 35 y.o. female  was evaluated in triage.  Pt complains of vaginal bleeding with spotting that started over the weekend, now with worsening belly pain, back pain and vaginal bleeding.Marland Kitchen  Approximately [redacted] weeks pregnant.  G7, P3.  Review of Systems  Positive: Vaginal bleeding, abdominal pain, back pain, abdominal cramping Negative: Chest pain, difficulty breathing  Physical Exam  BP 129/79   Pulse 97   Temp 98.3 F (36.8 C)   Resp 16   LMP 04/07/2021 (Approximate)   SpO2 100%  Gen:   Awake, no distress   Resp:  Normal effort  MSK:   Moves extremities without difficulty  Other:  RRR no M/R/G.  Lungs CTA B, abdomen soft, nondistended, nontender.  Medical Decision Making  Medically screening exam initiated at 12:41 PM.  Appropriate orders placed.  KAMBRY AIMONE was informed that the remainder of the evaluation will be completed by another provider, this initial triage assessment does not replace that evaluation, and the importance of remaining in the ED until their evaluation is complete.  Did speak with [facility] provider, Rolitta who is agreeable to receiving this patient in her department.  Appreciate her collaboration in the care of this patient.  Transport has been called to transport patient to [facility].  This chart was dictated using voice recognition software, Dragon. Despite the best efforts of this provider to proofread and correct errors, errors may still occur which can change documentation meaning.    Aura Dials 06/13/21 1244    Regan Lemming, MD 06/13/21 2011

## 2021-06-13 NOTE — MAU Provider Note (Addendum)
History     CSN: [id number]  Arrival date and time: 06/13/21 1212   Event Date/Time   First Provider Initiated Contact with Patient 06/13/21 1506      Chief Complaint  Patient presents with   Vaginal Bleeding   Abdominal Pain   Vaginal Bleeding Associated symptoms include abdominal pain, chills, headaches and nausea. Pertinent negatives include no constipation, diarrhea or vomiting.  Abdominal Pain Associated symptoms include headaches and nausea. Pertinent negatives include no constipation, diarrhea or vomiting.   Rebecca [name] is a [id number] currently [redacted] weeks pregnant, here with vaginal bleeding, worsening abdominal and back pain.   Patient states the bleeding started this Saturday with a bit of spotting. She says the bleeding stopped and then started again Sunday night. She noticed the bleeding after wiping and described it as having a brown color. Bleeding has been continuous since and  patient describes color this morning as brown/dark red. She describes seeing chunks of tissue and clots every time she goes to the restroom since this morning. The patient also complains of abdominal pain that began yesterday night. She states the pain began in the LLQ and described the pain as cramping, dull, and constant. Patient now says the abdominal pain is diffuse in the lower quadrants bilaterally and says the pain comes and goes but continues to be cramping in nature. Lower back pain has also been present since yesterday night. She describes the pain as dull and constant, she says it feels as though she has to have a bowel movement but she is sure that is not the issue.  The patient is adamant that she is having a miscarriage. When asked if this was similar to previous miscarriage experience she says she wasn't sure as she is convinced that an antibiotic that was given during that time caused fetal demise. She mentions she moved yesterday and helped push a washing machine but denies it being  strenuous or participating in any other strenuous activity. Patient has been smoking 5-6 cigarettes per day throughout pregnancy but denies use of alcohol.   Patient states she started feeling cold and started having chills on her way to the hospital, she continues to feel cold in hospital. She complains of a headache, nauseas, and of feeling fatigued. She denies any changes to appetite and denies any changes to stooling and voiding. Patient states her last menstrual cycle was at the end of April. She does not track her cycle as she believes it is pretty regular.   OB History     Gravida  8   Para  3   Term  3   Preterm  0   AB  4   Living  3      SAB  1   IAB  3   Ectopic  0   Multiple  0   Live Births  3           Past Medical History:  Diagnosis Date   Abnormal Pap smear    Infection    trich, chlamydia   Urinary tract infection     Past Surgical History:  Procedure Laterality Date   INDUCED ABORTION     OPEN REDUCTION INTERNAL FIXATION (ORIF) DISTAL RADIAL FRACTURE Left 12/12/2014   Procedure: OPEN REDUCTION INTERNAL FIXATION (ORIF) LEFT DISTAL RADIAL FRACTURE;  Surgeon: Roseanne Kaufman, MD;  Location: [facility];  Service: Orthopedics;  Laterality: Left;    Family History  Problem Relation Age of Onset   Hypertension Mother  Diabetes Father    Other Neg Hx     Social History   Tobacco Use   Smoking status: Every Day    Packs/day: 1.00    Years: 8.00    Pack years: 8.00    Types: Cigarettes   Smokeless tobacco: Never  Substance Use Topics   Alcohol use: No   Drug use: Yes    Frequency: 7.0 times per week    Types: Marijuana    Comment: on the weekends    Allergies: No Known Allergies  Medications Prior to Admission  Medication Sig Dispense Refill Last Dose   acetaminophen (TYLENOL) 500 MG tablet Take 1,000 mg by mouth every 6 (six) hours as needed for mild pain. (Patient not taking: Reported on 03/01/2021)      cyclobenzaprine (FLEXERIL)  10 MG tablet Take 1 tablet (10 mg total) by mouth 3 (three) times daily as needed for muscle spasms. (Patient not taking: Reported on 03/01/2021) 30 tablet 0    methocarbamol (ROBAXIN) 500 MG tablet Take 1 tablet (500 mg total) by mouth 2 (two) times daily. (Patient not taking: Reported on 03/01/2021) 20 tablet 0    metroNIDAZOLE (FLAGYL) 500 MG tablet Take 1 tablet (500 mg total) by mouth 2 (two) times daily. (Patient not taking: Reported on 03/01/2021) 14 tablet 0     Review of Systems  Constitutional:  Positive for chills and fatigue.  Eyes:  Negative for visual disturbance.  Respiratory:  Negative for chest tightness and shortness of breath.   Cardiovascular:  Negative for chest pain.  Gastrointestinal:  Positive for abdominal pain and nausea. Negative for constipation, diarrhea and vomiting.  Genitourinary:  Positive for vaginal bleeding.  Neurological:  Positive for headaches. Negative for dizziness and light-headedness.   Physical Exam   Blood pressure 138/81, pulse 73, temperature 97.9 F (36.6 C), temperature source Oral, resp. rate 18, height '5\' 6"'$  (1.676 m), weight 83.1 kg, last menstrual period 04/07/2021, SpO2 99 %, currently breastfeeding.  Physical Exam Exam conducted with a chaperone present.  Constitutional:      General: She is not in acute distress.    Appearance: She is not ill-appearing or diaphoretic.  HENT:     Head: Normocephalic and atraumatic.  Eyes:     Conjunctiva/sclera: Conjunctivae normal.  Cardiovascular:     Rate and Rhythm: Normal rate.  Pulmonary:     Effort: Pulmonary effort is normal.  Abdominal:     Palpations: Abdomen is soft.     Tenderness: There is generalized abdominal tenderness.  Genitourinary:    Comments: Speculum Exam: -Normal External Genitalia: Non tender, no apparent discharge at introitus.  -Vaginal Vault: Pink mucosa with good rugae. Small to moderate amt blood noted and removed with faux swab x 4 -wet prep  collected -Cervix:Pink, no lesions, cysts, or polyps.  Appears closed. No active bleeding from os-GC/CT collected -Bimanual Exam:  Deferred Musculoskeletal:        General: Normal range of motion.  Skin:    General: Skin is warm and dry.  Neurological:     Mental Status: She is alert and oriented to person, place, and time.     MAU Course  Procedures Results for orders placed or performed during the hospital encounter of 06/13/21 (from the past 24 hour(s))  Urinalysis, Routine w reflex microscopic Urine, Clean Catch     Status: Abnormal   Collection Time: 06/13/21  1:38 PM  Result Value Ref Range   Color, Urine YELLOW YELLOW   APPearance CLEAR CLEAR  Specific Gravity, Urine 1.010 1.005 - 1.030   pH 6.0 5.0 - 8.0   Glucose, UA NEGATIVE NEGATIVE mg/dL   Hgb urine dipstick LARGE (A) NEGATIVE   Bilirubin Urine NEGATIVE NEGATIVE   Ketones, ur NEGATIVE NEGATIVE mg/dL   Protein, ur NEGATIVE NEGATIVE mg/dL   Nitrite NEGATIVE NEGATIVE   Leukocytes,Ua NEGATIVE NEGATIVE  Urinalysis, Microscopic (reflex)     Status: None   Collection Time: 06/13/21  1:38 PM  Result Value Ref Range   RBC / HPF >50 0 - 5 RBC/hpf   WBC, UA NONE SEEN 0 - 5 WBC/hpf   Bacteria, UA NONE SEEN NONE SEEN   Squamous Epithelial / LPF NONE SEEN 0 - 5  hCG, quantitative, pregnancy     Status: Abnormal   Collection Time: 06/13/21  2:01 PM  Result Value Ref Range   hCG, Beta Chain, Quant, S 10,520 (H) <5 mIU/mL  CBC     Status: Abnormal   Collection Time: 06/13/21  2:01 PM  Result Value Ref Range   WBC 5.9 4.0 - 10.5 K/uL   RBC 4.50 3.87 - 5.11 MIL/uL   Hemoglobin 11.2 (L) 12.0 - 15.0 g/dL   HCT 35.0 (L) 36.0 - 46.0 %   MCV 77.8 (L) 80.0 - 100.0 fL   MCH 24.9 (L) 26.0 - 34.0 pg   MCHC 32.0 30.0 - 36.0 g/dL   RDW 17.1 (H) 11.5 - 15.5 %   Platelets 241 150 - 400 K/uL   nRBC 0.0 0.0 - 0.2 %  Rh IG workup (includes ABO/Rh)     Status: None (Preliminary result)   Collection Time: 06/13/21  2:05 PM  Result  Value Ref Range   Gestational Age(Wks) 9.4    ABO/RH(D) O NEG    Antibody Screen NEG    Unit Number [id number]    Blood Component Type RHIG    Unit division 00    Status of Unit ISSUED    Transfusion Status      OK TO TRANSFUSE Performed at [facility], [address] [place], [place] [address]   Wet prep, genital     Status: Abnormal   Collection Time: 06/13/21  3:51 PM   Specimen: PATH Cytology Cervicovaginal Ancillary Only  Result Value Ref Range   Yeast Wet Prep HPF POC NONE SEEN NONE SEEN   Trich, Wet Prep NONE SEEN NONE SEEN   Clue Cells Wet Prep HPF POC NONE SEEN NONE SEEN   WBC, Wet Prep HPF POC FEW (A) NONE SEEN   Sperm NONE SEEN    US OB LESS THAN 14 WEEKS WITH OB TRANSVAGINAL  Result Date: 06/13/2021 CLINICAL DATA:  Vaginal bleeding, abdominal pain. EXAM: OBSTETRIC <14 WK [place] AND TRANSVAGINAL OB US TECHNIQUE: Both transabdominal and transvaginal ultrasound examinations were performed for complete evaluation of the gestation as well as the maternal uterus, adnexal regions, and pelvic cul-de-sac. Transvaginal technique was performed to assess early pregnancy. COMPARISON:  None. FINDINGS: Intrauterine gestational sac: There are 2 small thick-walled irregular cystic appearing structures towards the fundal endometrium measuring a conglomerate size of 2 x 2.1 x 2.4 cm. Yolk sac:  Not Visualized. Embryo:  Not Visualized. Cardiac Activity: Not Visualized. Maternal uterus/adnexae: Anteverted maternal uterus. Irregular thick-walled cystic appearing structures towards the fundal endometrium, while these could reflect early gestational sac appearance is nonspecific in the absence of discernible yolk sac. Posterior fundal fibroid noted as well measuring 2.3 x 2.3 x 2.3 cm in size. Additional lateral posterior body fibroid measuring  4.3 x 4.1 x 2.9 cm. Normal appearance of the ovaries.  No pelvic free fluid. IMPRESSION: Question 2 small thick-walled cystic appearing structures towards  the fundal endometrium, overall indeterminate in appearance. Differential considerations would include early intrauterine pregnancy or spontaneous abortion recommend close clinical followup and serial quantitative beta HCGs and ultrasounds. Fibroid uterus. Electronically Signed   By: Lovena Le M.D.   On: 06/13/2021 17:05    MDM Physical Exam with pelvic evaluation  CBC, UA, GC/chlamydia were ordered to determine if patients blood loss was significant enough to cause further issues and to determine infection status.  Rho (d) immune globulin 32mg IM was administered, given patients blood type, O neg, and current bleeding.   Transvaginal ultrasound was ordered to assess fetal status. Reviewed by Radiology and provider.   Consulted with MD Assessment and Plan  35year old [id number] 9.4 weeks Vaginal Bleeding Rh Negative  On assessment today patient had pain in lower abdomen and had blood present on vaginal examination. Ultrasound was ordered to assess pregnancy viability. Ultrasound showed irregular intrauterine gestational sac with 2 small thick-walled irregular cystic appearing structures towards the fundal endometrium measuring a conglomerate size of 2 x 2.1 x 2.4 cm. This along with patients elevated bhCG, elevated to 10,520 is concerning for possible molar pregnancy. Given appropriate dating of pregnancy and blood clots/tissues seen by patient, it is also possible patient could be experiencing miscarriage.  Patient was educated on concerns for molar pregnancy and was told she is likely experiencing a miscarriage given her ultrasound result. She was advised to return to MAU tomorrow afternoon to repeat bhCG levels and possible repeat ultrasound if levels continue to rise to rule out molar pregnancy.  Gema J Rodriguez-Teodoro 06/13/2021, 4:19 PM   Attestation of Supervision of Student:  I confirm that I have verified the information documented in the medical student's note and that I have  also personally reperformed the history, physical exam and all medical decision making activities.  I have verified that all services and findings are accurately documented in this student's note; and I agree with management and plan as outlined in the documentation. I have also made any necessary editorial changes.  -Exam performed exclusively by provider. -Consult with Dr. UJeani Sow Anyanwu who advises: *Repeat hcG in 48 hours and if continues to elevate repeat [place] -Provider reviews findings with patient.  Patient understandably confused and with appropriate questions. -Patient reports she did pass a few blood clots, but they were not larger than a grape.  -[facility] -Patient states she can not miss work.  Instructed to follow up after work on Wednesday Aug 3rd in MAU.  Reiterated that if levels return elevated will plan for repeat [place]  However if decreased, miscarriage has occurred.  -Encouraged to call or return to MAU if symptoms worsen or with the onset of new symptoms. -Discharged to home in stable condition.   JMaryann Conners CHornickfor [employer] 06/13/2021 7:07 PM

## 2021-06-13 NOTE — MAU Note (Signed)
Rebecca [name] is a 35 y.o. at 14w4dhere in MAU reporting: vaginal bleeding started Saturday night. States bleeding is like a light period but is not wearing a pad. Seeing a little bit of blood in her panties. Small clots. Now cramping.  Onset of complaint: ongoing  Pain score: 4/10  Vitals:   06/13/21 1230 06/13/21 1322  BP: 129/79 138/81  Pulse: 97 73  Resp: 16 18  Temp: 98.3 F (36.8 C) 97.9 F (36.6 C)  SpO2: 100% 99%     Lab orders placed from triage: UA

## 2021-06-14 LAB — GC/CHLAMYDIA PROBE AMP (~~LOC~~) NOT AT ARMC
Chlamydia: NEGATIVE
Comment: NEGATIVE
Comment: NORMAL
Neisseria Gonorrhea: NEGATIVE

## 2021-06-14 LAB — RH IG WORKUP (INCLUDES ABO/RH)
ABO/RH(D): O NEG
Antibody Screen: NEGATIVE
Gestational Age(Wks): 9.4
Unit division: 0

## 2021-06-17 ENCOUNTER — Inpatient Hospital Stay (HOSPITAL_COMMUNITY): Payer: Medicaid Other

## 2021-06-17 ENCOUNTER — Inpatient Hospital Stay (HOSPITAL_COMMUNITY)
Admission: AD | Admit: 2021-06-17 | Discharge: 2021-06-17 | Disposition: A | Discharge status: Home / Self Care | Payer: Medicaid Other | Attending: Obstetrics and Gynecology | Admitting: Obstetrics and Gynecology

## 2021-06-17 ENCOUNTER — Other Ambulatory Visit: Payer: Medicaid Other

## 2021-06-17 ENCOUNTER — Encounter (HOSPITAL_COMMUNITY): Payer: Self-pay | Admitting: Obstetrics and Gynecology

## 2021-06-17 ENCOUNTER — Other Ambulatory Visit: Payer: Self-pay

## 2021-06-17 ENCOUNTER — Telehealth: Payer: Self-pay

## 2021-06-17 DIAGNOSIS — O4691 Antepartum hemorrhage, unspecified, first trimester: Secondary | ICD-10-CM

## 2021-06-17 DIAGNOSIS — O209 Hemorrhage in early pregnancy, unspecified: Secondary | ICD-10-CM | POA: Diagnosis not present

## 2021-06-17 DIAGNOSIS — O3680X0 Pregnancy with inconclusive fetal viability, not applicable or unspecified: Secondary | ICD-10-CM

## 2021-06-17 DIAGNOSIS — O99331 Smoking (tobacco) complicating pregnancy, first trimester: Secondary | ICD-10-CM | POA: Insufficient documentation

## 2021-06-17 DIAGNOSIS — Z79899 Other long term (current) drug therapy: Secondary | ICD-10-CM | POA: Diagnosis not present

## 2021-06-17 DIAGNOSIS — F17210 Nicotine dependence, cigarettes, uncomplicated: Secondary | ICD-10-CM | POA: Diagnosis not present

## 2021-06-17 DIAGNOSIS — O99321 Drug use complicating pregnancy, first trimester: Secondary | ICD-10-CM | POA: Insufficient documentation

## 2021-06-17 DIAGNOSIS — O26891 Other specified pregnancy related conditions, first trimester: Secondary | ICD-10-CM

## 2021-06-17 DIAGNOSIS — F1290 Cannabis use, unspecified, uncomplicated: Secondary | ICD-10-CM | POA: Insufficient documentation

## 2021-06-17 DIAGNOSIS — Z3A10 10 weeks gestation of pregnancy: Secondary | ICD-10-CM | POA: Insufficient documentation

## 2021-06-17 DIAGNOSIS — R1030 Lower abdominal pain, unspecified: Secondary | ICD-10-CM | POA: Insufficient documentation

## 2021-06-17 DIAGNOSIS — Z8249 Family history of ischemic heart disease and other diseases of the circulatory system: Secondary | ICD-10-CM | POA: Insufficient documentation

## 2021-06-17 LAB — CBC
HCT: 35.6 % — ABNORMAL LOW (ref 36.0–46.0)
Hemoglobin: 11.6 g/dL — ABNORMAL LOW (ref 12.0–15.0)
MCH: 25.1 pg — ABNORMAL LOW (ref 26.0–34.0)
MCHC: 32.6 g/dL (ref 30.0–36.0)
MCV: 76.9 fL — ABNORMAL LOW (ref 80.0–100.0)
Platelets: 200 10*3/uL (ref 150–400)
RBC: 4.63 MIL/uL (ref 3.87–5.11)
RDW: 17.4 % — ABNORMAL HIGH (ref 11.5–15.5)
WBC: 4.3 10*3/uL (ref 4.0–10.5)
nRBC: 0 % (ref 0.0–0.2)

## 2021-06-17 LAB — HCG, QUANTITATIVE, PREGNANCY: hCG, Beta Chain, Quant, S: 3378 m[IU]/mL — ABNORMAL HIGH (ref <5)

## 2021-06-17 MED ORDER — METHYLERGONOVINE MALEATE 0.2 MG PO TABS: 0.2000 mg | ORAL_TABLET | Freq: Three times a day (TID) | ORAL | 0 refills | Status: DC

## 2021-06-17 MED ORDER — ONDANSETRON 4 MG PO TBDP
4.0000 mg | ORAL_TABLET | Freq: Once | ORAL | Status: AC
  Administered 2021-06-17: 4 mg via ORAL
  Filled 2021-06-17: qty 1

## 2021-06-17 MED ORDER — HYDROMORPHONE HCL 1 MG/ML IJ SOLN
1.0000 mg | Freq: Once | INTRAMUSCULAR | Status: AC
  Administered 2021-06-17: 1 mg via INTRAMUSCULAR
  Filled 2021-06-17: qty 1

## 2021-06-17 NOTE — MAU Note (Signed)
Presents with c/o VB and abdominal pain that began Saturday..Reports VB has worsened and pain has intensified.  Reports passing blood clots and changing pads every 45 minutes to an hour.  States needs pain management.

## 2021-06-17 NOTE — Telephone Encounter (Signed)
Spoke with patient after speaking with Dr. Elly Modena. Pt was advised to go to MAU for repeat quant and evaluation of pain and heavy bleeding. Pt was not happy with this assessment as she was just wanting something to help with pain. Advised patient that it is important that she be assessed for the bleeding and pain and get repeat quant so that best course of treatment can be determined. She would be treated for pain and bleeding while at the hospital. Pt reluctantly agreed due to amount of pain and will be heading to MAU for repeat quant and evaluation of pain and bleeding.

## 2021-06-17 NOTE — MAU Provider Note (Signed)
Chief Complaint:  Abdominal Pain and Vaginal Bleeding   None    HPI: Rebecca [name] is a 35 y.o. [id number] at 36w1dwho presents to maternity admissions reporting abdominal pain and vaginal bleeding. Patient reports lower abdominal pain started Wednesday and has progressively gotten worse. She describes the pain as "labor like". She has tried Motrin, Tylenol, Midol and nothing is helping the pain. She says that she took 6 Motrin prior to coming to MAU today. She also reports ongoing vaginal bleeding and having to change her pad every 1-1.5 hours. Is passing some small clots, biggest being about the size of a 50 cent piece.  Of further note, she reports that she had a positive home Covid test yesterday. Started showing symptoms on Wednesday, however is no longer having symptoms.    Pregnancy Course:   Past Medical History:  Diagnosis Date   Abnormal Pap smear    Infection    trich, chlamydia   Urinary tract infection    OB History  Gravida Para Term Preterm AB Living  '8 3 3 '$ 0 4 3  SAB IAB Ectopic Multiple Live Births  1 3 0 0 3    # Outcome Date GA Lbr Len/2nd Weight Sex Delivery Anes PTL Lv  8 Current           7 Term 12/03/14 [id number]:27 / 00:32 3099 g F Vag-Spont EPI  LIV  6 IAB 2011        DEC  5 IAB 2010        DEC  4 IAB 2009        DEC  3 SAB 2007 1248w0d    DEC  2 Term 06/02/05   3997 g M Vag-Spont EPI N LIV  1 Term 05/13/04   3771 g M Vag-Spont EPI N LIV   Past Surgical History:  Procedure Laterality Date   INDUCED ABORTION     OPEN REDUCTION INTERNAL FIXATION (ORIF) DISTAL RADIAL FRACTURE Left 12/12/2014   Procedure: OPEN REDUCTION INTERNAL FIXATION (ORIF) LEFT DISTAL RADIAL FRACTURE;  Surgeon: WilRoseanne KaufmanD;  [facility]  Laterality: Left;   Family History  Problem Relation Age of Onset   Hypertension Mother    Diabetes Father    Other Neg Hx    Social History   Tobacco Use   Smoking status: Every Day    Packs/day: 1.00     Years: 8.00    Pack years: 8.00    Types: Cigarettes   Smokeless tobacco: Never  Substance Use Topics   Alcohol use: No   Drug use: Yes    Frequency: 7.0 times per week    Types: Marijuana    Comment: on the weekends   No Known Allergies No medications prior to admission.   I have reviewed patient's Past Medical Hx, Surgical Hx, Family Hx, Social Hx, medications and allergies.   ROS:  Review of Systems  Constitutional: Negative.   Respiratory: Negative.    Cardiovascular: Negative.   Gastrointestinal:  Positive for abdominal pain. Negative for nausea and vomiting.  Genitourinary:  Positive for vaginal bleeding.  Musculoskeletal: Negative.   Neurological: Negative.    Physical Exam  Patient Vitals for the past 24 hrs:  BP Temp Temp src Pulse Resp SpO2 Height Weight  06/17/21 1720 134/75 97.6 F (36.4 C) Oral 61 18 100 % -- --  06/17/21 1445 -- -- -- -- -- 99 % -- --  06/17/21 1351 -- -- -- -- --  97 % -- --  06/17/21 1305 (!) 153/92 97.8 F (36.6 C) Oral 81 20 100 % -- --  06/17/21 1256 -- -- -- -- -- -- '5\' 6"'$  (1.676 m) 81.5 kg   Constitutional: well-developed, well-nourished female in moderate acute distress  Cardiovascular: normal rate Respiratory: normal effort GI: abd soft, non-tender MS: extremities nontender, no edema, normal ROM Neurologic: alert and oriented x 4, no focal deficit  GU: neg CVAT. Pelvic: NEFG, cervix appears dilated, POC's sitting in vagina removed with ring forceps, miminal bleeding, cervix clean without lesions.      Labs: Results for orders placed or performed during the hospital encounter of 06/17/21 (from the past 24 hour(s))  hCG, quantitative, pregnancy     Status: Abnormal   Collection Time: 06/17/21  2:01 PM  Result Value Ref Range   hCG, Beta Chain, Quant, S 3,378 (H) <5 mIU/mL  CBC     Status: Abnormal   Collection Time: 06/17/21  2:01 PM  Result Value Ref Range   WBC 4.3 4.0 - 10.5 K/uL   RBC 4.63 3.87 - 5.11 MIL/uL    Hemoglobin 11.6 (L) 12.0 - 15.0 g/dL   HCT 35.6 (L) 36.0 - 46.0 %   MCV 76.9 (L) 80.0 - 100.0 fL   MCH 25.1 (L) 26.0 - 34.0 pg   MCHC 32.6 30.0 - 36.0 g/dL   RDW 17.4 (H) 11.5 - 15.5 %   Platelets 200 150 - 400 K/uL   nRBC 0.0 0.0 - 0.2 %    Imaging:  US OB Transvaginal  Result Date: 06/17/2021 CLINICAL DATA:  Vaginal bleeding, early pregnancy EXAM: OBSTETRIC <14 WK ULTRASOUND TECHNIQUE: Transabdominal ultrasound was performed for evaluation of the gestation as well as the maternal uterus and adnexal regions. COMPARISON:  06/13/2021, 01/28/2020 FINDINGS: Intrauterine gestational sac: None Yolk sac:  Not Visualized. Embryo:  Not Visualized. Cardiac Activity: Not Visualized. Subchorionic hemorrhage:  None visualized. Maternal uterus/adnexae: There is a heterogeneous, lobulated, multi-cystic appearing mass in the fundal endometrial cavity measuring 2.6 x 2.2 x 2.3 cm. Additional uterine fibroids. Normal right ovary. The left ovary is nonvisualized. Trace, nonspecific free fluid in the low pelvis. IMPRESSION: 1.  No candidate viable intrauterine gestation identified. 2. There is a heterogeneous, lobulated, multi cystic appearing mass in the fundal endometrial cavity measuring 2.6 x 2.2 x 2.3 cm. In the setting of pregnancy, particularly given expected gestational age of [redacted] weeks, this is highly suspicious for a complete molar pregnancy. However, the patient has uterine fibroids, at least one of which in the anterior uterine fundus seen more clearly on prior ultrasound dated 01/28/2020 approximately corresponds to this lesion, and this may reflect a large submucosal or pedunculated fibroid component. Electronically Signed   By: Eddie Candle M.D.   On: 06/17/2021 17:24     MAU Course: Orders Placed This Encounter  Procedures   US OB Transvaginal   hCG, quantitative, pregnancy   CBC   Meds ordered this encounter  Medications   HYDROmorphone (DILAUDID) injection 1 mg   ondansetron (ZOFRAN-ODT)  disintegrating tablet 4 mg    MDM: CBC, Hgb 11.6  HCG 3,378, down from 10,520 4 days prior Dilaudid '1mg'$  IM with significant improvement in pain O neg, received rhogam on 8/1 Speculum exam with removal of possible POC, sent for surgical pathology [place] with results as above  Zofran ODT for nausea and vomiting ordered Discussed case with Dr. Elgie Congo who recommends patient take Methergine 0.'2mg'$  q8 hours x3 doses with repeat hcg in 1  week Bleeding and strict return precautions reviewed with patient  Assessment: 1. Vaginal bleeding affecting early pregnancy   2. Pregnancy, location unknown      Plan: Discharge home in stable condition  Appointment scheduled on 06/24/2021 for repeat hcg Return to MAU as needed   Allergies as of 06/17/2021   No Known Allergies      Medication List     TAKE these medications    methylergonovine 0.2 MG tablet Commonly known as: METHERGINE Take 1 tablet (0.2 mg total) by mouth every 8 (eight) hours.         Maryagnes Amos, MSN, CNM 06/17/2021 5:55 PM

## 2021-06-17 NOTE — Telephone Encounter (Signed)
Pt called stating that she had appointment at 10:30 this morning to come in for lab work but would not be able to make it due to + Covid test. Pt wanted to know if there was anything that she could have to help with pain due to severe cramping. She states she is having increased pain over the last 2 days and bleeding due to MAB. Please advise, thanks.

## 2021-06-21 LAB — SURGICAL PATHOLOGY

## 2021-06-24 ENCOUNTER — Ambulatory Visit: Payer: Medicaid Other

## 2021-06-24 ENCOUNTER — Other Ambulatory Visit: Payer: Medicaid Other

## 2021-06-29 ENCOUNTER — Other Ambulatory Visit: Payer: Self-pay

## 2021-06-29 ENCOUNTER — Encounter: Payer: Self-pay | Admitting: Women's Health

## 2021-06-29 ENCOUNTER — Other Ambulatory Visit (HOSPITAL_COMMUNITY)
Admission: RE | Admit: 2021-06-29 | Discharge: 2021-06-29 | Disposition: A | Discharge status: Home / Self Care | Payer: Medicaid Other | Source: Ambulatory Visit | Attending: Women's Health | Admitting: Women's Health

## 2021-06-29 ENCOUNTER — Ambulatory Visit (INDEPENDENT_AMBULATORY_CARE_PROVIDER_SITE_OTHER): Payer: Medicaid Other | Admitting: Women's Health

## 2021-06-29 VITALS — BP 135/89 | HR 72 | Wt 184.0 lb

## 2021-06-29 DIAGNOSIS — Z124 Encounter for screening for malignant neoplasm of cervix: Secondary | ICD-10-CM | POA: Insufficient documentation

## 2021-06-29 DIAGNOSIS — N898 Other specified noninflammatory disorders of vagina: Secondary | ICD-10-CM | POA: Diagnosis present

## 2021-06-29 DIAGNOSIS — Z8759 Personal history of other complications of pregnancy, childbirth and the puerperium: Secondary | ICD-10-CM | POA: Diagnosis not present

## 2021-06-29 NOTE — Progress Notes (Signed)
Pt is here today for follow up for recent SAB.  Pt states she is having scant bleeding today, more d/c like.  Pt states she does have d/c with smell today. Pt has not had repeat Hcg since hospital visit.

## 2021-06-29 NOTE — Patient Instructions (Signed)
Contraception Choices - www.bedsider.org Contraception, also called birth control, refers to methods or devices thatprevent pregnancy. Hormonal methods  Contraceptive implant A contraceptive implant is a thin, plastic tube that contains a hormone that prevents pregnancy. It is different from an intrauterine device (IUD). It is inserted into the upper part of the arm by a health care provider. Implants canbe effective for up to 3 years. Progestin-only injections Progestin-only injections are injections of progestin, a synthetic form of thehormone progesterone. They are given every 3 months by a health care provider. Birth control pills Birth control pills are pills that contain hormones that prevent pregnancy. They must be taken once a day, preferably at the same time each day. Aprescription is needed to use this method of contraception. Birth control patch The birth control patch contains hormones that prevent pregnancy. It is placed on the skin and must be changed once a week for three weeks and removed on thefourth week. A prescription is needed to use this method of contraception. Vaginal ring A vaginal ring contains hormones that prevent pregnancy. It is placed in the vagina for three weeks and removed on the fourth week. After that, the process is repeated with a new ring. A prescription is needed to use this method ofcontraception. Emergency contraceptive Emergency contraceptives prevent pregnancy after unprotected sex. They come in pill form and can be taken up to 5 days after sex. They work best the sooner they are taken after having sex. Most emergency contraceptives are available without a prescription. This method should not be used as your only form ofbirth control. Barrier methods  Female condom A female condom is a thin sheath that is worn over the penis during sex. Condoms keep sperm from going inside a woman's body. They can be used with a sperm-killing substance (spermicide) to  increase their effectiveness. They should be thrown away after one use. Female condom A female condom is a soft, loose-fitting sheath that is put into the vagina before sex. The condom keeps sperm from going inside a woman's body. Theyshould be thrown away after one use. Diaphragm A diaphragm is a soft, dome-shaped barrier. It is inserted into the vagina before sex, along with a spermicide. The diaphragm blocks sperm from entering the uterus, and the spermicide kills sperm. A diaphragm should be left in thevagina for 6-8 hours after sex and removed within 24 hours. A diaphragm is prescribed and fitted by a health care provider. A diaphragm should be replaced every 1-2 years, after giving birth, after gaining more than15 lb (6.8 kg), and after pelvic surgery. Cervical cap A cervical cap is a round, soft latex or plastic cup that fits over the cervix. It is inserted into the vagina before sex, along with spermicide. It blocks sperm from entering the uterus. The cap should be left in place for 6-8 hours after sex and removed within 48 hours. A cervical cap must be prescribed andfitted by a health care provider. It should be replaced every 2 years. Sponge A sponge is a soft, circular piece of polyurethane foam with spermicide in it. The sponge helps block sperm from entering the uterus, and the spermicide kills sperm. To use it, you make it wet and then insert it into the vagina. It should be inserted before sex, left in for at least 6 hours after sex, and removed andthrown away within 30 hours. Spermicides Spermicides are chemicals that kill or block sperm from entering the cervix and uterus. They can come as a cream, jelly, suppository,  foam, or tablet. A spermicide should be inserted into the vagina with an applicator at least [id number] minutes before sex to allow time for it to work. The process must be repeatedevery time you have sex. Spermicides do not require a prescription. Intrauterine  contraception Intrauterine device (IUD) An IUD is a T-shaped device that is put in a woman's uterus. There are two types: Hormone IUD.This type contains progestin, a synthetic form of the hormone progesterone. This type can stay in place for 3-5 years. Copper IUD.This type is wrapped in copper wire. It can stay in place for 10 years. Permanent methods of contraception Female tubal ligation In this method, a woman's fallopian tubes are sealed, tied, or blocked duringsurgery to prevent eggs from traveling to the uterus. Hysteroscopic sterilization In this method, a small, flexible insert is placed into each fallopian tube. The inserts cause scar tissue to form in the fallopian tubes and block them, so sperm cannot reach an egg. The procedure takes about 3 months to be effective.Another form of birth control must be used during those 3 months. Female sterilization This is a procedure to tie off the tubes that carry sperm (vasectomy). After the procedure, the man can still ejaculate fluid (semen). Another form of birth control must be used for 3 months after the procedure. Natural planning methods Natural family planning In this method, a couple does not have sex on days when the woman could become pregnant. Calendar method In this method, the woman keeps track of the length of each menstrual cycle, identifies the days when pregnancy can happen, and does not have sex on those days. Ovulation method In this method, a couple avoids sex during ovulation. Symptothermal method This method involves not having sex during ovulation. The woman typically checks for ovulation bywatching changes in her temperature and in the consistency of cervical mucus. Post-ovulation method In this method, a couple waits to have sex until after ovulation. Where to find more information Centers for Disease Control and Prevention: http://www.wolf.info/ Summary Contraception, also called birth control, refers to methods or devices  that prevent pregnancy. Hormonal methods of contraception include implants, injections, pills, patches, vaginal rings, and emergency contraceptives. Barrier methods of contraception can include female condoms, female condoms, diaphragms, cervical caps, sponges, and spermicides. There are two types of IUDs (intrauterine devices). An IUD can be put in a woman's uterus to prevent pregnancy for 3-5 years. Permanent sterilization can be done through a procedure for males and females. Natural family planning methods involve nothaving sex on days when the woman could become pregnant. This information is not intended to replace advice given to you by your health care provider. Make sure you discuss any questions you have with your healthcare provider. Document Revised: 04/05/2020 Document Reviewed: 04/05/2020 Elsevier Patient Education  [place].

## 2021-06-29 NOTE — Progress Notes (Signed)
  History:  Ms. Rebecca [name] is a 35 y.o. [id number] who presents to clinic today for SAB f/u. Patient reports blood-tinged discharge and no cramping. Patient has not had Pap since 2015. Patient does not wish to be pregnant at this time and states her current birth control is abstinence and she does not wish to start on birth control at this time. Patient is current smoker.  The following portions of the patient's history were reviewed and updated as appropriate: allergies, current medications, family history, past medical history, social history, past surgical history and problem list.  Review of Systems:  Review of Systems  Genitourinary:        +vaginal discharge  All other systems reviewed and are negative.   Objective:  Physical Exam BP 135/89   Pulse 72   Wt 184 lb (83.5 kg)   LMP 04/07/2021 (Approximate)   Breastfeeding Unknown   BMI 29.70 kg/m   Physical Exam Vitals and nursing note reviewed. Exam conducted with a chaperone present.  Constitutional:      General: She is not in acute distress.    Appearance: Normal appearance. She is not ill-appearing, toxic-appearing or diaphoretic.  HENT:     Head: Normocephalic and atraumatic.  Pulmonary:     Effort: Pulmonary effort is normal.  Abdominal:     Palpations: Abdomen is soft.  Genitourinary:    General: Normal vulva.     Labia:        Right: No rash, tenderness or lesion.        Left: No rash, tenderness or lesion.      Vagina: Normal.     Cervix: Normal.  Skin:    General: Skin is warm and dry.  Neurological:     Mental Status: She is alert and oriented to person, place, and time.  Psychiatric:        Mood and Affect: Mood normal.        Behavior: Behavior normal.        Thought Content: Thought content normal.        Judgment: Judgment normal.   Labs and Imaging No results found for this or any previous visit (from the past 24 hour(s)).  No results found.  Health Maintenance Due  Topic Date Due    COVID-19 Vaccine (1) Never done   Pneumococcal Vaccine 56-70 Years old (1 - PCV) Never done   Hepatitis C Screening  Never done   TETANUS/TDAP  Never done   PAP SMEAR-Modifier  06/17/2017   INFLUENZA VACCINE  06/13/2021    Assessment & Plan:  1. Miscarriage within last 12 months - POCT urine pregnancy - Beta hCG quant (ref lab) - discussed BC options, info given, pt declines at this time  2. Screening for cervical cancer - Cytology - PAP( [place])  3. Vaginal discharge - Cervicovaginal ancillary only( [place])  Approximately 10 minutes of total time was spent with this patient on counseling and exam.  Clarisa Fling, NP 06/29/2021 10:04 AM

## 2021-06-30 LAB — CERVICOVAGINAL ANCILLARY ONLY
Bacterial Vaginitis (gardnerella): POSITIVE — AB
Candida Glabrata: NEGATIVE
Candida Vaginitis: NEGATIVE
Chlamydia: NEGATIVE
Comment: NEGATIVE
Comment: NEGATIVE
Comment: NEGATIVE
Comment: NEGATIVE
Comment: NEGATIVE
Comment: NORMAL
Neisseria Gonorrhea: NEGATIVE
Trichomonas: NEGATIVE

## 2021-06-30 LAB — BETA HCG QUANT (REF LAB): hCG Quant: 8 m[IU]/mL

## 2021-06-30 NOTE — Progress Notes (Signed)
Please call pt to schedule for a lab only visit in one week for hCG. Patient has been notified via [place]. Thank you! Rebecca [name]

## 2021-07-01 ENCOUNTER — Other Ambulatory Visit: Payer: Self-pay

## 2021-07-01 DIAGNOSIS — N898 Other specified noninflammatory disorders of vagina: Secondary | ICD-10-CM

## 2021-07-01 LAB — CYTOLOGY - PAP
Comment: NEGATIVE
Diagnosis: NEGATIVE
Diagnosis: REACTIVE
High risk HPV: NEGATIVE

## 2021-07-01 MED ORDER — METRONIDAZOLE 500 MG PO TABS: 500.0000 mg | ORAL_TABLET | Freq: Two times a day (BID) | ORAL | 0 refills | Status: DC

## 2021-07-01 NOTE — Progress Notes (Signed)
Pt wants treatment Rx sent for BV made aware of results via Mychart by Vernice Jefferson, NP

## 2021-07-06 ENCOUNTER — Other Ambulatory Visit: Payer: Medicaid Other

## 2021-09-19 ENCOUNTER — Telehealth: Payer: Self-pay | Admitting: Obstetrics and Gynecology

## 2021-09-19 MED ORDER — TINIDAZOLE 500 MG PO TABS: 2.0000 g | ORAL_TABLET | Freq: Every day | ORAL | 0 refills | Status: AC

## 2021-09-19 MED ORDER — METRONIDAZOLE 0.75 % VA GEL: 1.0000 | Freq: Two times a day (BID) | VAGINAL | 0 refills | Status: DC

## 2021-09-19 NOTE — Telephone Encounter (Signed)
Rx changed to Tindamax per protocol.  Patient declines vaginal applicator.

## 2021-09-19 NOTE — Addendum Note (Signed)
Addended by: Tarry Kos on: 09/19/2021 11:35 AM   Modules accepted: Orders

## 2021-09-19 NOTE — Telephone Encounter (Signed)
Patient called requesting treatment for BV.  Patien requests something different than the Flagyl she took before.   Metrogel routed to pharmacy per protocol.

## 2021-09-20 ENCOUNTER — Telehealth: Payer: Self-pay

## 2021-09-20 ENCOUNTER — Other Ambulatory Visit: Payer: Self-pay

## 2021-09-20 NOTE — Telephone Encounter (Signed)
Prior authorization was submitted to patient's insurance company for [employer]. Prior Authorization Request [id number]. Awaiting decision.

## 2022-01-03 ENCOUNTER — Other Ambulatory Visit: Payer: Self-pay | Admitting: *Deleted

## 2022-01-03 DIAGNOSIS — N898 Other specified noninflammatory disorders of vagina: Secondary | ICD-10-CM

## 2022-01-03 MED ORDER — METRONIDAZOLE 500 MG PO TABS: 500.0000 mg | ORAL_TABLET | Freq: Two times a day (BID) | ORAL | 0 refills | Status: DC

## 2022-04-06 ENCOUNTER — Emergency Department (HOSPITAL_COMMUNITY): Payer: Medicaid Other

## 2022-04-06 ENCOUNTER — Encounter (HOSPITAL_COMMUNITY): Payer: Self-pay | Admitting: Emergency Medicine

## 2022-04-06 ENCOUNTER — Emergency Department (HOSPITAL_COMMUNITY)
Admission: EM | Admit: 2022-04-06 | Discharge: 2022-04-07 | Disposition: A | Discharge status: Home / Self Care | Payer: Medicaid Other | Attending: Emergency Medicine | Admitting: Emergency Medicine

## 2022-04-06 ENCOUNTER — Other Ambulatory Visit: Payer: Self-pay

## 2022-04-06 DIAGNOSIS — S6991XA Unspecified injury of right wrist, hand and finger(s), initial encounter: Secondary | ICD-10-CM | POA: Diagnosis present

## 2022-04-06 DIAGNOSIS — W228XXA Striking against or struck by other objects, initial encounter: Secondary | ICD-10-CM | POA: Insufficient documentation

## 2022-04-06 DIAGNOSIS — S62306A Unspecified fracture of fifth metacarpal bone, right hand, initial encounter for closed fracture: Secondary | ICD-10-CM

## 2022-04-06 DIAGNOSIS — M79641 Pain in right hand: Secondary | ICD-10-CM | POA: Insufficient documentation

## 2022-04-06 DIAGNOSIS — S62336A Displaced fracture of neck of fifth metacarpal bone, right hand, initial encounter for closed fracture: Secondary | ICD-10-CM | POA: Diagnosis not present

## 2022-04-06 MED ORDER — IBUPROFEN 800 MG PO TABS
800.0000 mg | ORAL_TABLET | Freq: Once | ORAL | Status: AC
  Administered 2022-04-06: 800 mg via ORAL
  Filled 2022-04-06: qty 1

## 2022-04-06 NOTE — ED Provider Triage Note (Signed)
Emergency Medicine Provider Triage Evaluation Note  Rebecca [name] , a 36 y.o. female  was evaluated in triage.  Pt complains of right hand pain after trying to karate chop a piece of wood.  This happened just prior to arrival.  Denies any other injury.  Review of Systems  Positive:  Negative: See above  Physical Exam  BP (!) 146/84   Pulse 85   Temp 98.1 F (36.7 C) (Oral)   Resp 18   Wt 83.5 kg   LMP 04/04/2022 (Approximate)   SpO2 98%   Breastfeeding No   BMI 29.70 kg/m  Gen:   Awake, no distress   Resp:  Normal effort  MSK:   Moves extremities without difficulty  Other:  Mild swelling over the dorsal aspect of the hand.  She is neurovascular intact in the fingers.  2+ radial pulse felt on the right.  Medical Decision Making  Medically screening exam initiated at 9:11 PM.  Appropriate orders placed.  SHAUNICE LEVITAN was informed that the remainder of the evaluation will be completed by another provider, this initial triage assessment does not replace that evaluation, and the importance of remaining in the ED until their evaluation is complete.     Myna Bright [place], [place] 04/06/22 2112

## 2022-04-06 NOTE — ED Triage Notes (Signed)
Pt was joking with her kids and pretending to karate chop wood plank with her hand, injured her right hand during this. Sensation intact to R hand but decreased grip strength. No previous injuries to this limb.

## 2022-04-07 MED ORDER — HYDROCODONE-ACETAMINOPHEN 5-325 MG PO TABS
1.0000 | ORAL_TABLET | Freq: Once | ORAL | Status: AC
  Administered 2022-04-07: 1 via ORAL
  Filled 2022-04-07: qty 1

## 2022-04-07 NOTE — ED Provider Notes (Signed)
[facility] EMERGENCY DEPARTMENT Provider Note   CSN: [id number] Arrival date & time: 04/06/22  2048     History  Chief Complaint  Patient presents with   Hand Injury    Rebecca [name] is a 36 y.o. female.  The history is provided by the patient.  Hand Injury She injured her right hand, states that she tried to apply a karate chop to a piece of wood.  She is complaining of pain in the ulnar aspect of the right hand.   Home Medications Prior to Admission medications   Medication Sig Start Date End Date Taking? Authorizing Provider  metroNIDAZOLE (FLAGYL) 500 MG tablet Take 1 tablet (500 mg total) by mouth 2 (two) times daily. 01/03/22   Shelly Bombard, MD      Allergies    Patient has no known allergies.    Review of Systems   Review of Systems  All other systems reviewed and are negative.  Physical Exam Updated Vital Signs BP (!) 146/84   Pulse 85   Temp 98.1 F (36.7 C) (Oral)   Resp 18   Wt 83.5 kg   LMP 04/04/2022 (Approximate)   SpO2 98%   Breastfeeding No   BMI 29.70 kg/m  Physical Exam Vitals and nursing note reviewed.  36 year old female, resting comfortably and in no acute distress. Vital signs are significant for borderline elevated blood pressure. Oxygen saturation is 98%, which is normal. Head is normocephalic and atraumatic. PERRLA, EOMI. Oropharynx is clear. Neck is nontender and supple without adenopathy or JVD. Back is nontender and there is no CVA tenderness. Lungs are clear without rales, wheezes, or rhonchi. Chest is nontender. Heart has regular rate and rhythm without murmur. Abdomen is soft, flat, nontender. Extremities: There is ecchymosis of the ulnar aspect of the right hand in the area of the distal fifth metacarpal.  There is marked tenderness at that same location.  There is no deformity.  She is able to move extend her fingers and make a fist without any obvious deformity.  Distal neurovascular exam is intact  with prompt capillary refill, normal sensation. Skin is warm and dry without rash. Neurologic: Mental status is normal, cranial nerves are intact, moves all extremities equally.  ED Results / Procedures / Treatments    Radiology DG Hand Complete Right  Result Date: 04/06/2022 CLINICAL DATA:  Hand injury. EXAM: RIGHT HAND - COMPLETE 3+ VIEW COMPARISON:  None Available. FINDINGS: There is an acute fracture of the fifth metacarpal neck mildly comminuted with mild apex lateral angulation. There is overlying soft tissue swelling. No dislocation. IMPRESSION: Acute fracture of the fifth metacarpal neck. Electronically Signed   By: Ronney Asters M.D.   On: 04/06/2022 21:39    Procedures .Splint Application  Date/Time: 04/07/2022 12:07 AM Performed by: Delora Fuel, MD Authorized by: Delora Fuel, MD   Consent:    Consent obtained:  Verbal   Consent given by:  Patient   Risks, benefits, and alternatives were discussed: yes     Risks discussed:  Pain and numbness   Alternatives discussed:  No treatment Universal protocol:    Procedure explained and questions answered to patient or proxy's satisfaction: yes     Relevant documents present and verified: yes     Test results available: yes     Imaging studies available: yes     Required blood products, implants, devices, and special equipment available: yes     Site/side marked: yes  Immediately prior to procedure a time out was called: yes     Patient identity confirmed:  Verbally with patient and arm band Pre-procedure details:    Distal neurologic exam:  Normal   Distal perfusion: brisk capillary refill   Procedure details:    Location:  Hand   Hand location:  R hand   Splint type:  Ulnar gutter   Supplies:  Elastic bandage and fiberglass Post-procedure details:    Distal neurologic exam:  Normal   Distal perfusion: unchanged     Procedure completion:  Tolerated well, no immediate complications    Medications Ordered in  ED Medications  HYDROcodone-acetaminophen (NORCO/VICODIN) 5-325 MG per tablet 1 tablet (has no administration in time range)  ibuprofen (ADVIL) tablet 800 mg (800 mg Oral Given 04/06/22 2141)    ED Course/ Medical Decision Making/ A&P                           Medical Decision Making Risk Prescription drug management.   Right hand injury suspicious for boxer's fracture.  X-rays are obtained and confirm fracture of the distal right fifth metacarpal with slight angulation.  I am independently viewed the images, and agree with the radiologist's interpretation.  She is placed in an ulnar gutter splint and discharged with instructions to apply ice and use over-the-counter NSAIDs and acetaminophen as needed for pain.  She is referred to hand surgery for follow-up.  Final Clinical Impression(s) / ED Diagnoses Final diagnoses:  Closed fracture of fifth metacarpal bone of right hand, initial encounter    Rx / DC Orders ED Discharge Orders     None         Delora Fuel, MD 70/48/88 0009

## 2022-04-07 NOTE — Discharge Instructions (Signed)
Apply ice for 30 minutes at a time, 4 times a day.  Keep your hand elevated is much as possible.  Take ibuprofen or naproxen as needed for pain.  To get additional pain relief, add acetaminophen.  Please be aware that when you combine acetaminophen with either ibuprofen or naproxen, you will get better pain relief and you get from taking either medication by itself.

## 2022-04-07 NOTE — Progress Notes (Signed)
Orthopedic Tech Progress Note Patient Details:  Rebecca [name] [birth year] [id number]  Ortho Devices Type of Ortho Device: Ulna gutter splint Ortho Device/Splint Location: rue Ortho Device/Splint Interventions: Ordered, Application, Adjustment   Post Interventions Patient Tolerated: Well  Maxyne Derocher L Kynadie Yaun 04/07/2022, 12:30 AM

## 2022-04-07 NOTE — ED Notes (Signed)
Ortho at bedside to spilt pt wrist

## 2022-05-08 ENCOUNTER — Other Ambulatory Visit: Payer: Self-pay | Admitting: Obstetrics

## 2022-05-08 DIAGNOSIS — N898 Other specified noninflammatory disorders of vagina: Secondary | ICD-10-CM

## 2022-05-19 MED ORDER — METRONIDAZOLE 500 MG PO TABS: 500.0000 mg | ORAL_TABLET | Freq: Two times a day (BID) | ORAL | 0 refills | Status: DC

## 2022-08-17 ENCOUNTER — Other Ambulatory Visit: Payer: Self-pay | Admitting: Obstetrics & Gynecology

## 2022-08-17 DIAGNOSIS — N898 Other specified noninflammatory disorders of vagina: Secondary | ICD-10-CM

## 2022-08-21 ENCOUNTER — Encounter (HOSPITAL_COMMUNITY): Payer: Self-pay | Admitting: Emergency Medicine

## 2022-08-21 ENCOUNTER — Ambulatory Visit (HOSPITAL_COMMUNITY)
Admission: EM | Admit: 2022-08-21 | Discharge: 2022-08-21 | Disposition: A | Discharge status: Home / Self Care | Payer: Medicaid Other | Attending: Emergency Medicine | Admitting: Emergency Medicine

## 2022-08-21 ENCOUNTER — Other Ambulatory Visit: Payer: Self-pay | Admitting: Obstetrics & Gynecology

## 2022-08-21 DIAGNOSIS — F1290 Cannabis use, unspecified, uncomplicated: Secondary | ICD-10-CM | POA: Insufficient documentation

## 2022-08-21 DIAGNOSIS — B9689 Other specified bacterial agents as the cause of diseases classified elsewhere: Secondary | ICD-10-CM | POA: Diagnosis not present

## 2022-08-21 DIAGNOSIS — H9202 Otalgia, left ear: Secondary | ICD-10-CM | POA: Diagnosis not present

## 2022-08-21 DIAGNOSIS — Z87440 Personal history of urinary (tract) infections: Secondary | ICD-10-CM | POA: Insufficient documentation

## 2022-08-21 DIAGNOSIS — N760 Acute vaginitis: Secondary | ICD-10-CM | POA: Diagnosis not present

## 2022-08-21 DIAGNOSIS — D259 Leiomyoma of uterus, unspecified: Secondary | ICD-10-CM | POA: Diagnosis not present

## 2022-08-21 DIAGNOSIS — H9212 Otorrhea, left ear: Secondary | ICD-10-CM | POA: Diagnosis present

## 2022-08-21 DIAGNOSIS — N898 Other specified noninflammatory disorders of vagina: Secondary | ICD-10-CM

## 2022-08-21 DIAGNOSIS — M545 Low back pain, unspecified: Secondary | ICD-10-CM | POA: Insufficient documentation

## 2022-08-21 DIAGNOSIS — Z113 Encounter for screening for infections with a predominantly sexual mode of transmission: Secondary | ICD-10-CM | POA: Insufficient documentation

## 2022-08-21 DIAGNOSIS — F17210 Nicotine dependence, cigarettes, uncomplicated: Secondary | ICD-10-CM | POA: Insufficient documentation

## 2022-08-21 DIAGNOSIS — K5900 Constipation, unspecified: Secondary | ICD-10-CM | POA: Diagnosis not present

## 2022-08-21 DIAGNOSIS — R350 Frequency of micturition: Secondary | ICD-10-CM | POA: Insufficient documentation

## 2022-08-21 LAB — COMPREHENSIVE METABOLIC PANEL
ALT: 13 U/L (ref 0–44)
AST: 14 U/L — ABNORMAL LOW (ref 15–41)
Albumin: 3.4 g/dL — ABNORMAL LOW (ref 3.5–5.0)
Alkaline Phosphatase: 58 U/L (ref 38–126)
Anion gap: 4 — ABNORMAL LOW (ref 5–15)
BUN: 14 mg/dL (ref 6–20)
CO2: 24 mmol/L (ref 22–32)
Calcium: 9.8 mg/dL (ref 8.9–10.3)
Chloride: 109 mmol/L (ref 98–111)
Creatinine, Ser: 0.63 mg/dL (ref 0.44–1.00)
GFR, Estimated: >60 mL/min (ref >60)
Glucose, Bld: 95 mg/dL (ref 70–99)
Potassium: 3.6 mmol/L (ref 3.5–5.1)
Sodium: 137 mmol/L (ref 135–145)
Total Bilirubin: 0.4 mg/dL (ref 0.3–1.2)
Total Protein: 6.5 g/dL (ref 6.5–8.1)

## 2022-08-21 LAB — CBC WITH DIFFERENTIAL/PLATELET
Abs Immature Granulocytes: 0.01 K/uL (ref 0.00–0.07)
Basophils Absolute: 0 K/uL (ref 0.0–0.1)
Basophils Relative: 1 %
Eosinophils Absolute: 0.1 K/uL (ref 0.0–0.5)
Eosinophils Relative: 1 %
HCT: 26.8 % — ABNORMAL LOW (ref 36.0–46.0)
Hemoglobin: 8.3 g/dL — ABNORMAL LOW (ref 12.0–15.0)
Immature Granulocytes: 0 %
Lymphocytes Relative: 24 %
Lymphs Abs: 1.5 K/uL (ref 0.7–4.0)
MCH: 21.3 pg — ABNORMAL LOW (ref 26.0–34.0)
MCHC: 31 g/dL (ref 30.0–36.0)
MCV: 68.9 fL — ABNORMAL LOW (ref 80.0–100.0)
Monocytes Absolute: 0.4 K/uL (ref 0.1–1.0)
Monocytes Relative: 6 %
Neutro Abs: 4.1 K/uL (ref 1.7–7.7)
Neutrophils Relative %: 68 %
Platelets: 264 K/uL (ref 150–400)
RBC: 3.89 MIL/uL (ref 3.87–5.11)
RDW: 16.8 % — ABNORMAL HIGH (ref 11.5–15.5)
WBC: 6 K/uL (ref 4.0–10.5)
nRBC: 0 % (ref 0.0–0.2)

## 2022-08-21 LAB — POCT URINALYSIS DIPSTICK, ED / UC
Bilirubin Urine: NEGATIVE
Glucose, UA: NEGATIVE mg/dL
Hgb urine dipstick: NEGATIVE
Leukocytes,Ua: NEGATIVE
Nitrite: NEGATIVE
Protein, ur: NEGATIVE mg/dL
Specific Gravity, Urine: 1.025 (ref 1.005–1.030)
Urobilinogen, UA: 0.2 mg/dL (ref 0.0–1.0)
pH: 6 (ref 5.0–8.0)

## 2022-08-21 LAB — POC URINE PREG, ED: Preg Test, Ur: NEGATIVE

## 2022-08-21 LAB — LIPASE, BLOOD: Lipase: 27 U/L (ref 11–51)

## 2022-08-21 MED ORDER — CETIRIZINE HCL 10 MG PO TABS: 10.0000 mg | ORAL_TABLET | Freq: Every day | ORAL | 2 refills | Status: AC

## 2022-08-21 MED ORDER — DOCUSATE SODIUM 100 MG PO CAPS: 100.0000 mg | ORAL_CAPSULE | Freq: Two times a day (BID) | ORAL | 0 refills | Status: AC

## 2022-08-21 NOTE — ED Provider Notes (Addendum)
[facility]    CSN: [id number] Arrival date & time: 08/21/22  8101      History   Chief Complaint Chief Complaint  Patient presents with   Ear Drainage    HPI Rebecca [name] is a 36 y.o. female.   Patient presents to urgent care for evaluation of left-sided ear drainage and decreased hearing to the left ear for about 2 weeks.  States that she was experiencing yellow discharge from the left ear couple weeks ago but this has since improved.  Denies headache, cough, nasal congestion, blurry vision, ear pain bilaterally, shortness of breath, chest pain, and other URI symptoms.  She has a history of seasonal allergic rhinitis but does not currently take anything for this.  No recent swimming or submerging head underwater.  Denies ear itching.  She would also like to be evaluated for her low back pain that started 3 days ago.  Denies known injury or trauma to the low back and states that this type of low back discomfort has happened in the past.  Ibuprofen 600 mg every 6 hours has not been helping with the low back discomfort. States her boyfriend recently passed away traumatically in 06-17-22 and she has been drinking more alcohol than usual recently.  She is concerned that she may have a kidney infection and states she has had urinary frequency but denies dysuria, urgency, hesitancy, and hematuria.  She has also been drinking more sodas/juices than normal lately but has tried to increase her water intake over the last 2 to 3 days to prevent dehydration.  She has not had a fever or chills at home.  She also reports a history of uterine fibroids and has considered this as the possible cause of her low back discomfort.  She has not been seen by an OB/GYN in "a long time" and last menstrual cycle was recent in September 2023.  She has been very constipated lately and states that prior to a couple of days ago, she had not had a bowel movement for approximately 1 week.  Her mom gave her some  "pills that were orange" and this caused her to have 1-2 episodes of watery stools.  Denies blood/mucus to the stools.  Denies numbness and tingling to the bilateral lower extremities, abdominal pain, nausea, vomiting, dizziness, diarrhea, decreased appetite, and flank pain.  She has never had a kidney stone but reports positive family history of kidney stones (mother).  Reports unprotected intercourse in August 2023 with new female partner but is not currently having any vaginal symptoms.  Would like to be tested for STDs today.  She is a current every day smoker and intermittently uses marijuana.    Ear Drainage    Past Medical History:  Diagnosis Date   Abnormal Pap smear    Infection    trich, chlamydia   Urinary tract infection     Patient Active Problem List   Diagnosis Date Noted   Radius distal fracture 12/11/2014   NSVD (normal spontaneous vaginal delivery) 12/03/2014   Post-dates pregnancy 12/02/2014   Supervision of other normal pregnancy 06/17/2014   Smoker 10/29/2013    Past Surgical History:  Procedure Laterality Date   INDUCED ABORTION     OPEN REDUCTION INTERNAL FIXATION (ORIF) DISTAL RADIAL FRACTURE Left 12/12/2014   Procedure: OPEN REDUCTION INTERNAL FIXATION (ORIF) LEFT DISTAL RADIAL FRACTURE;  Surgeon: Roseanne Kaufman, MD;  Location: [facility];  Service: Orthopedics;  Laterality: Left;    OB History  Gravida  8   Para  3   Term  3   Preterm  0   AB  4   Living  3      SAB  1   IAB  3   Ectopic  0   Multiple  0   Live Births  3            Home Medications    Prior to Admission medications   Medication Sig Start Date End Date Taking? Authorizing Provider  cetirizine (ZYRTEC) 10 MG tablet Take 1 tablet (10 mg total) by mouth daily. 08/21/22  Yes Talbot Grumbling, FNP  docusate sodium (COLACE) 100 MG capsule Take 1 capsule (100 mg total) by mouth every 12 (twelve) hours. 08/21/22  Yes Talbot Grumbling, FNP  metroNIDAZOLE (FLAGYL)  500 MG tablet Take 1 tablet (500 mg total) by mouth 2 (two) times daily. 05/19/22   Woodroe Mode, MD    Family History Family History  Problem Relation Age of Onset   Hypertension Mother    Diabetes Father    Other Neg Hx     Social History Social History   Tobacco Use   Smoking status: Every Day    Packs/day: 1.00    Years: 8.00    Total pack years: 8.00    Types: Cigarettes   Smokeless tobacco: Never  Substance Use Topics   Alcohol use: No   Drug use: Not Currently    Frequency: 7.0 times per week    Types: Marijuana    Comment: on the weekends     Allergies   Patient has no known allergies.   Review of Systems Review of Systems Per HPI  Physical Exam Triage Vital Signs ED Triage Vitals  Enc Vitals Group     BP 08/21/22 1001 (!) 128/91     Pulse Rate 08/21/22 1001 89     Resp 08/21/22 1001 20     Temp 08/21/22 1001 97.9 F (36.6 C)     Temp Source 08/21/22 1001 Oral     SpO2 08/21/22 1001 98 %     Weight --      Height --      Head Circumference --      Peak Flow --      Pain Score 08/21/22 0959 2     Pain Loc --      Pain Edu? --      Excl. in [place]? --    No data found.  Updated Vital Signs BP (!) 128/91 (BP Location: Right Arm)   Pulse 89   Temp 97.9 F (36.6 C) (Oral)   Resp 20   LMP 08/11/2022   SpO2 98%   Visual Acuity Right Eye Distance:   Left Eye Distance:   Bilateral Distance:    Right Eye Near:   Left Eye Near:    Bilateral Near:     Physical Exam Vitals and nursing note reviewed.  Constitutional:      Appearance: She is not ill-appearing or toxic-appearing.  HENT:     Head: Normocephalic and atraumatic.     Right Ear: Hearing, ear canal and external ear normal. No drainage or swelling. A middle ear effusion is present. Tympanic membrane is not erythematous.     Left Ear: Hearing, ear canal and external ear normal. No drainage or swelling. A middle ear effusion is present. Tympanic membrane is not erythematous.     Nose:      Right Turbinates: Swollen  and pale.     Left Turbinates: Swollen and pale.     Mouth/Throat:     Lips: Pink.     Mouth: Mucous membranes are moist.     Pharynx: Posterior oropharyngeal erythema present.  Eyes:     General: Lids are normal. Vision grossly intact. Gaze aligned appropriately.     Extraocular Movements: Extraocular movements intact.     Conjunctiva/sclera: Conjunctivae normal.  Cardiovascular:     Rate and Rhythm: Normal rate and regular rhythm.     Heart sounds: Normal heart sounds, S1 normal and S2 normal.  Pulmonary:     Effort: Pulmonary effort is normal. No respiratory distress.     Breath sounds: Normal breath sounds and air entry.  Abdominal:     General: Abdomen is flat. Bowel sounds are normal.     Palpations: Abdomen is soft.     Tenderness: There is abdominal tenderness in the right lower quadrant and suprapubic area. There is right CVA tenderness. There is no left CVA tenderness, guarding or rebound. Negative signs include Murphy's sign and McBurney's sign.     Comments: No peritoneal signs.  Musculoskeletal:     Cervical back: Neck supple.  Skin:    General: Skin is warm and dry.     Capillary Refill: Capillary refill takes less than 2 seconds.     Findings: No rash.  Neurological:     General: No focal deficit present.     Mental Status: She is alert and oriented to person, place, and time. Mental status is at baseline.     Cranial Nerves: No dysarthria or facial asymmetry.  Psychiatric:        Mood and Affect: Mood normal.        Speech: Speech normal.        Behavior: Behavior normal.        Thought Content: Thought content normal.        Judgment: Judgment normal.      UC Treatments / Results  Labs (all labs ordered are listed, but only abnormal results are displayed) Labs Reviewed  POCT URINALYSIS DIPSTICK, ED / UC - Abnormal; Notable for the following components:      Result Value   Ketones, ur TRACE (*)    All other components  within normal limits  CBC WITH DIFFERENTIAL/PLATELET  COMPREHENSIVE METABOLIC PANEL  LIPASE, BLOOD  POC URINE PREG, ED  CERVICOVAGINAL ANCILLARY ONLY    EKG   Radiology No results found.  Procedures Procedures (including critical care time)  Medications Ordered in UC Medications - No data to display  Initial Impression / Assessment and Plan / UC Course  I have reviewed the triage vital signs and the nursing notes.  Pertinent labs & imaging results that were available during my care of the patient were reviewed by me and considered in my medical decision making (see chart for details).   1.  Acute bilateral low back pain without sciatica Unclear etiology of patient's lower back discomfort.  The following were considered as contributing factors for her low back pain: Uterine fibroids, urinary tract infection, kidney stone, STI, constipation, and musculoskeletal etiology.  She is mostly nontender to palpation of the lower back with normal range of motion and stable musculoskeletal exam.  She is tender to the right CVA, although urine is unremarkable for signs of urinary tract infection and without leukocytosis.  Urine pregnancy is negative.  Constipation is more likely contributing to lower back pain and patient advised to begin  taking stool softener once daily, increase fiber intake, and increase water intake to maintain good bowel habits.  Deferred imaging due to stable abdominal exam findings without peritoneal signs in clinic today.  Low suspicion for acute small bowel obstruction as she is not nauseous, vomiting, and with minimal pain to the right lower quadrant on exam.  STD testing is pending and will come back in the next 2 to 3 days.  We will call patient with positive results and treat accordingly at that time.  Advised to avoid sexual intercourse until STI testing comes back to prevent spread of STI to partners.  CBC and CMP drawn today to assess for infection, electrolyte  abnormality, and anemia.  Lipase drawn to assess for possible acute pancreatitis related to recent increased alcohol intake as a grief reaction.  She does not experience symptoms of withdrawal after abrupt cessation of alcohol intake.  We will call patient with abnormal results.  Patient would benefit from PCP follow-up and ongoing evaluation of lower back discomfort/preventative health care.  PCP assistance initiated today.   She may continue using ibuprofen, Tylenol, and heat to the lower back.  Gentle range of motion exercises advised as well.  Contact information for OB/GYN given for patient to follow-up on her uterine fibroids via ultrasound.   2.  Left ear pain Bilateral middle ear effusions present to exam today.  Patient to begin taking cetirizine once daily to dry up fluid and improve symptoms. No signs of otitis media/otitis externa to exam today.  She is agreeable with this plan.  Discussed physical exam and available lab work findings in clinic with patient.  Counseled patient regarding appropriate use of medications and potential side effects for all medications recommended or prescribed today. Discussed red flag signs and symptoms of worsening condition,when to call the PCP office, return to urgent care, and when to seek higher level of care in the emergency department. Patient verbalizes understanding and agreement with plan. All questions answered. Patient discharged in stable condition.    Final Clinical Impressions(s) / UC Diagnoses   Final diagnoses:  Acute bilateral low back pain without sciatica  Urinary frequency  Screen for STD (sexually transmitted disease)  Left ear pain  Constipation, unspecified constipation type     Discharge Instructions      Take cetirizine once daily to dry up the fluid behind your eardrums causing your decreased hearing and left ear pain.  Your urine was negative for signs of urinary tract infection today.  Decrease the amount of soda and  alcohol you are drinking.  Increase the amount of water you are drinking to stay well-hydrated.  Soda and alcohol are urinary irritants and will worsen your urinary symptoms.  Your STD testing is pending and will come back in the next 2 to 3 days.  We will call you with any positive results needing treatment.  Do not have any sexual intercourse until your STD testing comes back.  If you're positive for any STDs, you may not have intercourse for 1 week while you are being treated.  Take Colace once daily to help with your constipation to soften your stools.  Increase your water and fiber intake by eating fruits, vegetables, and whole grains.  Continue using ibuprofen and Tylenol as needed for your low back pain.  You may also apply heat and perform gentle range of motion exercises to your lower back to prevent muscle stiffness.  Call the OB/GYN listed on your paperwork for a follow-up appointment regarding  her uterine fibroids as this may be causing some of your lower back and abdominal discomfort.  We drew blood work today to assess for anemia, infection, and electrolyte imbalances.  We will call you if any of these results are abnormal in the next 1 to 2 days.     ED Prescriptions     Medication Sig Dispense Auth. Provider   cetirizine (ZYRTEC) 10 MG tablet Take 1 tablet (10 mg total) by mouth daily. 30 tablet Joella Prince M, FNP   docusate sodium (COLACE) 100 MG capsule Take 1 capsule (100 mg total) by mouth every 12 (twelve) hours. 60 capsule Talbot Grumbling, FNP      PDMP not reviewed this encounter.   Talbot Grumbling, FNP 08/21/22 [place], [place], FNP 08/21/22 1150

## 2022-08-21 NOTE — ED Triage Notes (Signed)
Pt c/o left ear pain, fullness and now drainage yellow fluid for 3 weeks. Pains radiating to neck and jaw.  Pt having back pains for 3 days. Pt reports hasnt been been able to have BM so mother gave her medication to help have a BM. Denies falls or injuries.

## 2022-08-21 NOTE — Discharge Instructions (Addendum)
Take cetirizine once daily to dry up the fluid behind your eardrums causing your decreased hearing and left ear pain.  Your urine was negative for signs of urinary tract infection today.  Decrease the amount of soda and alcohol you are drinking.  Increase the amount of water you are drinking to stay well-hydrated.  Soda and alcohol are urinary irritants and will worsen your urinary symptoms.  Your STD testing is pending and will come back in the next 2 to 3 days.  We will call you with any positive results needing treatment.  Do not have any sexual intercourse until your STD testing comes back.  If you're positive for any STDs, you may not have intercourse for 1 week while you are being treated.  Take Colace once daily to help with your constipation to soften your stools.  Increase your water and fiber intake by eating fruits, vegetables, and whole grains.  Continue using ibuprofen and Tylenol as needed for your low back pain.  You may also apply heat and perform gentle range of motion exercises to your lower back to prevent muscle stiffness.  Call the OB/GYN listed on your paperwork for a follow-up appointment regarding her uterine fibroids as this may be causing some of your lower back and abdominal discomfort.  We drew blood work today to assess for anemia, infection, and electrolyte imbalances.  We will call you if any of these results are abnormal in the next 1 to 2 days.

## 2022-08-22 ENCOUNTER — Telehealth (HOSPITAL_COMMUNITY): Payer: Self-pay | Admitting: Emergency Medicine

## 2022-08-22 LAB — CERVICOVAGINAL ANCILLARY ONLY
Bacterial Vaginitis (gardnerella): POSITIVE — AB
Candida Glabrata: NEGATIVE
Candida Vaginitis: NEGATIVE
Chlamydia: NEGATIVE
Comment: NEGATIVE
Comment: NEGATIVE
Comment: NEGATIVE
Comment: NEGATIVE
Comment: NEGATIVE
Comment: NORMAL
Neisseria Gonorrhea: NEGATIVE
Trichomonas: NEGATIVE

## 2022-08-22 MED ORDER — METRONIDAZOLE 500 MG PO TABS: 500.0000 mg | ORAL_TABLET | Freq: Two times a day (BID) | ORAL | 0 refills | Status: DC

## 2022-08-31 ENCOUNTER — Encounter: Payer: Medicaid Other | Admitting: Family Medicine

## 2022-08-31 NOTE — Progress Notes (Signed)
This encounter was created in error - please disregard.

## 2022-11-26 ENCOUNTER — Encounter: Payer: Self-pay | Admitting: Family Medicine

## 2022-11-28 ENCOUNTER — Telehealth: Payer: Self-pay | Admitting: *Deleted

## 2022-11-28 NOTE — Telephone Encounter (Signed)
TC from pt requesting refill Flagyl. No documented annual exam since 09/2021. Advised RN unable to send med by protocol. Advised pt to scheduled nurse visit  for self swab. Visit scheduled for 9:40 11/29/22.

## 2022-11-29 ENCOUNTER — Ambulatory Visit: Payer: Medicaid Other

## 2023-01-08 ENCOUNTER — Ambulatory Visit (INDEPENDENT_AMBULATORY_CARE_PROVIDER_SITE_OTHER): Payer: Medicaid Other

## 2023-01-08 ENCOUNTER — Other Ambulatory Visit (HOSPITAL_COMMUNITY)
Admission: RE | Admit: 2023-01-08 | Discharge: 2023-01-08 | Disposition: A | Discharge status: Home / Self Care | Payer: Medicaid Other | Source: Ambulatory Visit | Attending: Obstetrics and Gynecology | Admitting: Obstetrics and Gynecology

## 2023-01-08 VITALS — BP 137/90 | HR 96 | Wt 169.6 lb

## 2023-01-08 DIAGNOSIS — N898 Other specified noninflammatory disorders of vagina: Secondary | ICD-10-CM | POA: Insufficient documentation

## 2023-01-08 DIAGNOSIS — Z113 Encounter for screening for infections with a predominantly sexual mode of transmission: Secondary | ICD-10-CM

## 2023-01-08 NOTE — Progress Notes (Signed)
SUBJECTIVE:  37 y.o. female complains of white vaginal discharge and requests all STD testing. Denies abnormal vaginal bleeding or significant pelvic pain or fever. No UTI symptoms. Denies history of known exposure to STD.   OBJECTIVE:  She appears well, afebrile. Urine dipstick: not done.  ASSESSMENT:  Vaginal Discharge  Vaginal Odor   PLAN:  GC, chlamydia, trichomonas, BVAG, CVAG probe sent to lab. Treatment: To be determined once lab results are received ROV prn if symptoms persist or worsen.

## 2023-01-09 LAB — CERVICOVAGINAL ANCILLARY ONLY
Bacterial Vaginitis (gardnerella): POSITIVE — AB
Candida Glabrata: NEGATIVE
Candida Vaginitis: POSITIVE — AB
Chlamydia: NEGATIVE
Comment: NEGATIVE
Comment: NEGATIVE
Comment: NEGATIVE
Comment: NEGATIVE
Comment: NEGATIVE
Comment: NORMAL
Neisseria Gonorrhea: NEGATIVE
Trichomonas: NEGATIVE

## 2023-01-11 ENCOUNTER — Other Ambulatory Visit: Payer: Self-pay | Admitting: Obstetrics and Gynecology

## 2023-01-11 MED ORDER — FLUCONAZOLE 150 MG PO TABS: 150.0000 mg | ORAL_TABLET | Freq: Once | ORAL | 0 refills | Status: AC

## 2023-01-11 MED ORDER — METRONIDAZOLE 500 MG PO TABS: 500.0000 mg | ORAL_TABLET | Freq: Two times a day (BID) | ORAL | 0 refills | Status: DC

## 2023-01-11 NOTE — Addendum Note (Signed)
Addended by: Mora Bellman on: 01/11/2023 10:34 AM   Modules accepted: Orders

## 2023-01-13 LAB — RPR: RPR Ser Ql: NONREACTIVE

## 2023-01-13 LAB — HEPATITIS B SURFACE ANTIGEN: Hepatitis B Surface Ag: NEGATIVE

## 2023-01-13 LAB — HIV ANTIBODY (ROUTINE TESTING W REFLEX): HIV Screen 4th Generation wRfx: NONREACTIVE

## 2023-01-13 LAB — HEPATITIS C ANTIBODY: Hep C Virus Ab: NONREACTIVE

## 2023-05-10 ENCOUNTER — Other Ambulatory Visit: Payer: Self-pay | Admitting: Obstetrics and Gynecology

## 2023-05-11 MED ORDER — METRONIDAZOLE 500 MG PO TABS: 500.0000 mg | ORAL_TABLET | Freq: Two times a day (BID) | ORAL | 0 refills | Status: DC

## 2023-08-20 ENCOUNTER — Other Ambulatory Visit: Payer: Self-pay | Admitting: Obstetrics & Gynecology

## 2023-08-20 MED ORDER — METRONIDAZOLE 500 MG PO TABS: 500.0000 mg | ORAL_TABLET | Freq: Two times a day (BID) | ORAL | 0 refills | Status: DC

## 2023-12-01 ENCOUNTER — Other Ambulatory Visit: Payer: Self-pay | Admitting: Obstetrics and Gynecology

## 2023-12-04 ENCOUNTER — Other Ambulatory Visit: Payer: Self-pay | Admitting: Obstetrics and Gynecology

## 2023-12-05 ENCOUNTER — Other Ambulatory Visit: Payer: Self-pay

## 2023-12-05 MED ORDER — METRONIDAZOLE 500 MG PO TABS: 500.0000 mg | ORAL_TABLET | Freq: Two times a day (BID) | ORAL | 0 refills | Status: DC

## 2023-12-05 NOTE — Progress Notes (Signed)
RX flagyl sent in per protocol. Symptoms of BV

## 2024-01-15 ENCOUNTER — Ambulatory Visit: Payer: Medicaid Other

## 2024-01-22 ENCOUNTER — Ambulatory Visit

## 2024-01-22 ENCOUNTER — Other Ambulatory Visit (HOSPITAL_COMMUNITY)
Admission: RE | Admit: 2024-01-22 | Discharge: 2024-01-22 | Disposition: A | Discharge status: Home / Self Care | Source: Ambulatory Visit | Attending: Obstetrics & Gynecology | Admitting: Obstetrics & Gynecology

## 2024-01-22 VITALS — BP 148/113 | HR 98 | Wt 170.6 lb

## 2024-01-22 DIAGNOSIS — Z113 Encounter for screening for infections with a predominantly sexual mode of transmission: Secondary | ICD-10-CM | POA: Diagnosis present

## 2024-01-22 NOTE — Progress Notes (Signed)
 SUBJECTIVE:  38 y.o. female who desires a STI screen. Denies abnormal vaginal discharge, bleeding or significant pelvic pain. No UTI symptoms. Denies history of known exposure to STD.  No LMP recorded.  OBJECTIVE:  She appears well.   ASSESSMENT:  STI Screen   PLAN:  Pt offered STI blood screening-declined GC, chlamydia, and trichomonas probe sent to lab.  Treatment: To be determined once lab results are received.  Pt aware that BP is high today in office, has issues with HTN previously. Also reports a headache, aware to f/u PCP for medication.  Pt follow up as needed.

## 2024-01-23 ENCOUNTER — Encounter: Payer: Self-pay | Admitting: Obstetrics and Gynecology

## 2024-01-23 ENCOUNTER — Other Ambulatory Visit: Payer: Self-pay | Admitting: Obstetrics and Gynecology

## 2024-01-23 DIAGNOSIS — B9689 Other specified bacterial agents as the cause of diseases classified elsewhere: Secondary | ICD-10-CM

## 2024-01-23 LAB — CERVICOVAGINAL ANCILLARY ONLY
Bacterial Vaginitis (gardnerella): POSITIVE — AB
Candida Glabrata: NEGATIVE
Candida Vaginitis: NEGATIVE
Chlamydia: NEGATIVE
Comment: NEGATIVE
Comment: NEGATIVE
Comment: NEGATIVE
Comment: NEGATIVE
Comment: NEGATIVE
Comment: NORMAL
Neisseria Gonorrhea: NEGATIVE
Trichomonas: NEGATIVE

## 2024-01-23 MED ORDER — METRONIDAZOLE 500 MG PO TABS: 500.0000 mg | ORAL_TABLET | Freq: Two times a day (BID) | ORAL | 0 refills | Status: DC

## 2024-04-21 ENCOUNTER — Telehealth: Admitting: Family Medicine

## 2024-04-21 ENCOUNTER — Encounter: Payer: Self-pay | Admitting: Family Medicine

## 2024-04-21 DIAGNOSIS — N760 Acute vaginitis: Secondary | ICD-10-CM

## 2024-04-21 NOTE — Progress Notes (Signed)
 Rebecca [name],  You are having a recurrence of this condition and should be seen in person for a recheck. You are having pain with intercourse and belly pain as well.  Your condition warrants further evaluation and we recommend that you be seen in a face-to-face visit at your PCP office or local urgent care.   NOTE: There will be NO CHARGE for this E-Visit   If you are having a true medical emergency, please call 911.

## 2024-04-22 ENCOUNTER — Other Ambulatory Visit: Payer: Self-pay

## 2024-04-22 DIAGNOSIS — B9689 Other specified bacterial agents as the cause of diseases classified elsewhere: Secondary | ICD-10-CM

## 2024-04-22 MED ORDER — METRONIDAZOLE 500 MG PO TABS: 500.0000 mg | ORAL_TABLET | Freq: Two times a day (BID) | ORAL | 0 refills | Status: DC

## 2024-05-28 ENCOUNTER — Telehealth: Admitting: Physician Assistant

## 2024-05-28 DIAGNOSIS — R3989 Other symptoms and signs involving the genitourinary system: Secondary | ICD-10-CM

## 2024-05-28 MED ORDER — SULFAMETHOXAZOLE-TRIMETHOPRIM 800-160 MG PO TABS: 1.0000 | ORAL_TABLET | Freq: Two times a day (BID) | ORAL | 0 refills | Status: DC

## 2024-05-28 NOTE — Patient Instructions (Signed)
 [name], thank you for joining [name], PA-C for today's virtual visit.  While this provider is not your primary care provider (PCP), if your PCP is located in our provider database this encounter information will be shared with them immediately following your visit.   A [facility] MyChart account gives you access to today's visit and all your visits, tests, and labs performed at [facility]  click here if you don't have a [facility] MyChart account or go to [email or web address]  Consent: (Patient) Rebecca [name] provided verbal consent for this virtual visit at the beginning of the encounter.  Current Medications:  Current Outpatient Medications:    sulfamethoxazole -trimethoprim  (BACTRIM  DS) 800-160 MG tablet, Take 1 tablet by mouth 2 (two) times daily., Disp: 10 tablet, Rfl: 0   cetirizine  (ZYRTEC ) 10 MG tablet, Take 1 tablet (10 mg total) by mouth daily. (Patient not taking: Reported on 01/22/2024), Disp: 30 tablet, Rfl: 2   docusate sodium  (COLACE) 100 MG capsule, Take 1 capsule (100 mg total) by mouth every 12 (twelve) hours. (Patient not taking: Reported on 01/22/2024), Disp: 60 capsule, Rfl: 0   metroNIDAZOLE  (FLAGYL ) 500 MG tablet, Take 1 tablet (500 mg total) by mouth 2 (two) times daily., Disp: 14 tablet, Rfl: 0   Medications ordered in this encounter:  Meds ordered this encounter  Medications   sulfamethoxazole -trimethoprim  (BACTRIM  DS) 800-160 MG tablet    Sig: Take 1 tablet by mouth 2 (two) times daily.    Dispense:  10 tablet    Refill:  0    Supervising Provider:   BLAISE ALEENE KIDD [[id number]]     *If you need refills on other medications prior to your next appointment, please contact your pharmacy*  Follow-Up: Call back or seek an in-person evaluation if the symptoms worsen or if the condition fails to improve as anticipated.  [facility] [phone]  Other Instructions  Urinary Tract Infection, Female A  urinary tract infection (UTI) is an infection in your urinary tract. The urinary tract is made up of organs that make, store, and get rid of pee (urine) in your body. These organs include: The kidneys. The ureters. The bladder. The urethra. What are the causes? Most UTIs are caused by germs called bacteria. They may be in or near your genitals. These germs grow and cause swelling in your urinary tract. What increases the risk? You're more likely to get a UTI if: You're a female. The urethra is shorter in females than in males. You have a soft tube called a catheter that drains your pee. You can't control when you pee or poop. You have trouble peeing because of: A kidney stone. A urinary blockage. A nerve condition that affects your bladder. Not getting enough to drink. You're sexually active. You use a birth control inside your vagina, like spermicide. You're pregnant. You have low levels of the hormone estrogen in your body. You're an older adult. You're also more likely to get a UTI if you have other health problems. These may include: Diabetes. A weak immune system. Your immune system is your body's defense system. Sickle cell disease. Injury of the spine. What are the signs or symptoms? Symptoms may include: Needing to pee right away. Peeing small amounts often. Pain or burning when you pee. Blood in your pee. Pee that smells bad or odd. Pain in your belly or lower back. You may also: Feel confused. This may be the first symptom in older adults. Vomit. Not  feel hungry. Feel tired or easily annoyed. Have a fever or chills. How is this diagnosed? A UTI is diagnosed based on your medical history and an exam. You may also have other tests. These may include: Pee tests. Blood tests. Tests for sexually transmitted infections (STIs). If you've had more than one UTI, you may need to have imaging studies done to find out why you keep getting them. How is this treated? A UTI  can be treated by: Taking antibiotics or other medicines. Drinking enough fluid to keep your pee pale yellow. In rare cases, a UTI can cause a very bad condition called sepsis. Sepsis may be treated in the hospital. Follow these instructions at home: Medicines Take your medicines only as told by your health care provider. If you were given antibiotics, take them as told by your provider. Do not stop taking them even if you start to feel better. General instructions Make sure you: Pee often and fully. Do not hold your pee for a long time. Wipe from front to back after you pee or poop. Use each tissue only once when you wipe. Pee after you have sex. Do not douche or use sprays or powders in your genital area. Contact a health care provider if: Your symptoms don't get better after 1-2 days of taking antibiotics. Your symptoms go away and then come back. You have a fever or chills. You vomit or feel like you may vomit. Get help right away if: You have very bad pain in your back or lower belly. You faint. This information is not intended to replace advice given to you by your health care provider. Make sure you discuss any questions you have with your health care provider. Document Revised: 10/10/2023 Document Reviewed: 02/02/2023 Elsevier Patient Education  [employer].   If you have been instructed to have an in-person evaluation today at a local Urgent Care facility, please use the link below. It will take you to a list of all of our available [facility], including address, phone number and hours of operation. Please do not delay care.  [facility]  If you or a family member do not have a primary care provider, use the link below to schedule a visit and establish care. When you choose a [facility] primary care physician or advanced practice provider, you gain a long-term partner in health. Find a Primary Care Provider  Learn more about [facility]'s  in-office and virtual care options: [facility] - Get Care Now

## 2024-05-28 NOTE — Progress Notes (Signed)
 Virtual Visit Consent   Rebecca [name], you are scheduled for a virtual visit with a [facility] provider today. Just as with appointments in the office, your consent must be obtained to participate. Your consent will be active for this visit and any virtual visit you may have with one of our providers in the next 365 days. If you have a MyChart account, a copy of this consent can be sent to you electronically.  As this is a virtual visit, video technology does not allow for your provider to perform a traditional examination. This may limit your provider's ability to fully assess your condition. If your provider identifies any concerns that need to be evaluated in person or the need to arrange testing (such as labs, EKG, etc.), we will make arrangements to do so. Although advances in technology are sophisticated, we cannot ensure that it will always work on either your end or our end. If the connection with a video visit is poor, the visit may have to be switched to a telephone visit. With either a video or telephone visit, we are not always able to ensure that we have a secure connection.  By engaging in this virtual visit, you consent to the provision of healthcare and authorize for your insurance to be billed (if applicable) for the services provided during this visit. Depending on your insurance coverage, you may receive a charge related to this service.  I need to obtain your verbal consent now. Are you willing to proceed with your visit today? Rebecca [name] has provided verbal consent on 05/28/2024 for a virtual visit (video or telephone). [name], PA-C  Date: 05/28/2024 10:14 AM   Virtual Visit via Video Note   I, [name], connected with  Rebecca [name]  ([id number], 05-06-1986) on 05/28/24 at 10:00 AM EDT by a video-enabled telemedicine application and verified that I am speaking with the correct person using two identifiers.  Location: Patient: Virtual Visit  Location Patient: Home Provider: Virtual Visit Location Provider: Home Office   I discussed the limitations of evaluation and management by telemedicine and the availability of in person appointments. The patient expressed understanding and agreed to proceed.    History of Present Illness: Rebecca [name] is a 38 y.o. who identifies as a female who was assigned female at birth, and is being seen today for dysuria and frequency.  HPI: Urinary Tract Infection  This is a new problem. The current episode started in the past 7 days (couple of days). The problem occurs every urination. The problem has been gradually worsening. The quality of the pain is described as burning. The pain is mild. There has been no fever. Associated symptoms include chills, frequency, hematuria (mild yesterday with wiping, but started her menstrual today), hesitancy, nausea and urgency. Pertinent negatives include no discharge, flank pain or vomiting. Associated symptoms comments: Pressure in suprapubic area, sweats. She has tried increased fluids and acetaminophen  (had recently been on Amoxicillin from dentist, completed 3 days ago; And has been on Metronidazole  for BV and took that today) for the symptoms. The treatment provided no relief. There is no history of recurrent UTIs.    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 (Patient not taking: Reported on 01/22/2024), Disp: 30 tablet, Rfl: 2   docusate sodium  (COLACE)  100 MG capsule, Take 1 capsule (100 mg total) by mouth every 12 (twelve) hours. (Patient not taking: Reported on 01/22/2024), Disp: 60 capsule, Rfl: 0   metroNIDAZOLE  (FLAGYL ) 500 MG tablet, Take 1 tablet (500 mg total) by mouth 2 (two) times daily., Disp: 14 tablet, Rfl: 0  Observations/Objective: Patient is well-developed, well-nourished in no acute distress.  Resting comfortably at home.  Head is normocephalic, atraumatic.  No labored breathing.  Speech is clear and coherent with logical content.  Patient is alert and oriented at baseline.    Assessment and Plan: 1. Suspected UTI (Primary) - sulfamethoxazole -trimethoprim  (BACTRIM  DS) 800-160 MG tablet; Take 1 tablet by mouth 2 (two) times daily.  Dispense: 10 tablet; Refill: 0  - Worsening symptoms.  - Will treat empirically with Bactrim  - May use AZO for bladder spasms - Continue to push fluids.  - Seek in person evaluation for urine culture if symptoms do not improve or if they worsen.    Follow Up Instructions: I discussed the assessment and treatment plan with the patient. The patient was provided an opportunity to ask questions and all were answered. The patient agreed with the plan and demonstrated an understanding of the instructions.  A copy of instructions were sent to the patient via MyChart unless otherwise noted below.    The patient was advised to call back or seek an in-person evaluation if the symptoms worsen or if the condition fails to improve as anticipated.    [name], PA-C

## 2024-06-09 ENCOUNTER — Other Ambulatory Visit: Payer: Self-pay

## 2024-06-09 DIAGNOSIS — B9689 Other specified bacterial agents as the cause of diseases classified elsewhere: Secondary | ICD-10-CM

## 2024-06-09 MED ORDER — METRONIDAZOLE 500 MG PO TABS: 500.0000 mg | ORAL_TABLET | Freq: Two times a day (BID) | ORAL | 0 refills | Status: DC

## 2024-08-15 ENCOUNTER — Telehealth: Admitting: Physician Assistant

## 2024-08-15 DIAGNOSIS — B9689 Other specified bacterial agents as the cause of diseases classified elsewhere: Secondary | ICD-10-CM | POA: Diagnosis not present

## 2024-08-15 DIAGNOSIS — K047 Periapical abscess without sinus: Secondary | ICD-10-CM

## 2024-08-15 DIAGNOSIS — N760 Acute vaginitis: Secondary | ICD-10-CM

## 2024-08-15 MED ORDER — CLINDAMYCIN HCL 300 MG PO CAPS: 300.0000 mg | ORAL_CAPSULE | Freq: Three times a day (TID) | ORAL | 0 refills | Status: AC

## 2024-08-15 NOTE — Progress Notes (Signed)
 Virtual Visit Consent   Rebecca [name], you are scheduled for a virtual visit with a [facility] provider today. Just as with appointments in the office, your consent must be obtained to participate. Your consent will be active for this visit and any virtual visit you may have with one of our providers in the next 365 days. If you have a MyChart account, a copy of this consent can be sent to you electronically.  As this is a virtual visit, video technology does not allow for your provider to perform a traditional examination. This may limit your provider's ability to fully assess your condition. If your provider identifies any concerns that need to be evaluated in person or the need to arrange testing (such as labs, EKG, etc.), we will make arrangements to do so. Although advances in technology are sophisticated, we cannot ensure that it will always work on either your end or our end. If the connection with a video visit is poor, the visit may have to be switched to a telephone visit. With either a video or telephone visit, we are not always able to ensure that we have a secure connection.  By engaging in this virtual visit, you consent to the provision of healthcare and authorize for your insurance to be billed (if applicable) for the services provided during this visit. Depending on your insurance coverage, you may receive a charge related to this service.  I need to obtain your verbal consent now. Are you willing to proceed with your visit today? Rebecca [name] has provided verbal consent on 08/15/2024 for a virtual visit (video or telephone). [name], PA-C  Date: 08/15/2024 8:12 AM   Virtual Visit via Video Note   I, [name], connected with  Rebecca [name]  ([id number], 11-11-1986) on 08/15/24 at  8:00 AM EDT by a video-enabled telemedicine application and verified that I am speaking with the correct person using two identifiers.  Location: Patient: Virtual Visit  Location Patient: Home Provider: Virtual Visit Location Provider: Home Office   I discussed the limitations of evaluation and management by telemedicine and the availability of in person appointments. The patient expressed understanding and agreed to proceed.    History of Present Illness: Rebecca [name] is a 38 y.o. who identifies as a female who was assigned female at birth, and is being seen today for 2 issues: vaginal discharge and dental pain.  HPI: Vaginal Discharge The patient's primary symptoms include a genital odor and vaginal discharge. This is a recurrent (Last July 2025) problem. The current episode started 1 to 4 weeks ago (just over a week). The problem occurs constantly. The problem has been unchanged. The patient is experiencing no pain. She is not pregnant. Pertinent negatives include no back pain (not worse than normal), chills, dysuria, fever, flank pain, frequency, nausea or vomiting. The vaginal discharge was milky, white and malodorous. There has been no bleeding. She has not been passing clots. She has not been passing tissue. Nothing aggravates the symptoms. She has tried nothing for the symptoms. The treatment provided no relief.  Dental Pain  This is a recurrent problem. The current episode started in the past 7 days. The problem occurs constantly. The problem has been unchanged. The pain is moderate. Associated symptoms include facial pain. Pertinent negatives include no fever. Treatments tried: amoxicillin. The treatment provided mild relief.    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 (Patient not taking: Reported on 01/22/2024), Disp: 30 tablet, Rfl: 2   docusate sodium  (COLACE) 100 MG capsule, Take 1 capsule (100 mg total) by mouth every 12 (twelve) hours. (Patient not taking: Reported on 01/22/2024), Disp: 60 capsule, Rfl: 0  Observations/Objective: Patient is well-developed, well-nourished in no acute distress.  Resting comfortably at home.  Head is normocephalic, atraumatic.  No labored breathing.  Speech is clear and coherent with logical content.  Patient is alert and oriented at baseline.    Assessment and Plan: 1. Dental infection (Primary) - clindamycin (CLEOCIN) 300 MG capsule; Take 1 capsule (300 mg total) by mouth 3 (three) times daily for 7 days.  Dispense: 21 capsule; Refill: 0  2. BV (bacterial vaginosis) - clindamycin (CLEOCIN) 300 MG capsule; Take 1 capsule (300 mg total) by mouth 3 (three) times daily for 7 days.  Dispense: 21 capsule; Refill: 0  - Will treat both issues with Clindamycin as above - May use Tylenol  and/or Ibuprofen  for pain - Salt water rinses and antiseptic mouthwash rinses for dental issue - Avoid scented soaps, lotion, detergents - Avoid tight fitting clothing - Seek in person evaluation if worsening  Follow Up Instructions: I discussed the assessment and treatment plan with the patient. The patient was provided an opportunity to ask questions and all were answered. The patient agreed with the plan and demonstrated an understanding of the instructions.  A copy of instructions were sent to the patient via MyChart unless otherwise noted below.    The patient was advised to call back or seek an in-person evaluation if the symptoms worsen or if the condition fails to improve as anticipated.    [name], PA-C

## 2024-08-15 NOTE — Patient Instructions (Addendum)
 [name], thank you for joining [name], PA-C for today's virtual visit.  While this provider is not your primary care provider (PCP), if your PCP is located in our provider database this encounter information will be shared with them immediately following your visit.   A [facility] MyChart account gives you access to today's visit and all your visits, tests, and labs performed at [facility]  click here if you don't have a [facility] MyChart account or go to [email or web address]  Consent: (Patient) Rebecca [name] provided verbal consent for this virtual visit at the beginning of the encounter.  Current Medications:  Current Outpatient Medications:    clindamycin (CLEOCIN) 300 MG capsule, Take 1 capsule (300 mg total) by mouth 3 (three) times daily for 7 days., Disp: 21 capsule, Rfl: 0   cetirizine  (ZYRTEC ) 10 MG tablet, Take 1 tablet (10 mg total) by mouth daily. (Patient not taking: Reported on 01/22/2024), Disp: 30 tablet, Rfl: 2   docusate sodium  (COLACE) 100 MG capsule, Take 1 capsule (100 mg total) by mouth every 12 (twelve) hours. (Patient not taking: Reported on 01/22/2024), Disp: 60 capsule, Rfl: 0   Medications ordered in this encounter:  Meds ordered this encounter  Medications   clindamycin (CLEOCIN) 300 MG capsule    Sig: Take 1 capsule (300 mg total) by mouth 3 (three) times daily for 7 days.    Dispense:  21 capsule    Refill:  0    Supervising Provider:   BLAISE ALEENE KIDD [[id number]]     *If you need refills on other medications prior to your next appointment, please contact your pharmacy*  Follow-Up: Call back or seek an in-person evaluation if the symptoms worsen or if the condition fails to improve as anticipated.  [facility] [phone]  Other Instructions Vaginal Probiotics: AZO vaginal probiotic OLLY Happy Hoo-Ha RAW Vaginal Care RenewLife Women's vaginal probiotic RepHresh Pro-B  Vaginal  washes: Honey Pot Summer's Eve Vagisil Feminine cleanser  Boric Acid Suppositories    Dental Abscess  A dental abscess is an area of pus in or around a tooth. It comes from an infection. It can cause pain and other symptoms. Treatment will help with symptoms and prevent the infection from spreading. What are the causes? This condition is caused by an infection in or around the tooth. This can be from: Very bad tooth decay (cavities). A bad injury to the tooth, such as a broken or chipped tooth. What increases the risk? The risk to get an abscess is higher in males. It is also more likely in people who: Have dental decay. Have very bad gum disease. Eat sugary snacks between meals. Use tobacco. Have diabetes. Have a weak disease-fighting system (immune system). Do not brush their teeth regularly. What are the signs or symptoms? Some mild symptoms are: Tenderness. Bad breath. Fever. A sharp, sour taste in the mouth. Pain in and around the infected tooth. Worse symptoms of this condition include: Swollen neck glands. Chills. Pus draining around the tooth. Swelling and redness around the tooth, the mouth, or the face. Very bad pain in and around the tooth. The worst symptoms can include: Difficulty swallowing. Difficulty opening your mouth. Feeling like you may vomit or vomiting. How is this treated? This is treated by getting rid of the infection. Your dentist will discuss ways to do this, including: Antibiotic medicines. Antibacterial mouth rinse. An incision in the abscess to drain out the pus. A root canal. Removing  the tooth. Follow these instructions at home: Medicines Take over-the-counter and prescription medicines only as told by your dentist. If you were prescribed an antibiotic medicine, take it as told by your dentist. Do not stop taking it even if you start to feel better. If you were prescribed a gel that has numbing medicine in it, use it exactly as  told. Ask your dentist if you should avoid driving or using machines while you are taking your medicine. General instructions Rinse your mouth often with salt water. To make salt water, dissolve -1 tsp (3-6 g) of salt in 1 cup (237 mL) of warm water. Eat a soft diet while your mouth is healing. Drink enough fluid to keep your pee (urine) pale yellow. Do not apply heat to the outside of your mouth. Do not smoke or use any products that contain nicotine or tobacco. If you need help quitting, ask your dentist. Keep all follow-up visits. Prevent an abscess Brush your teeth every morning and every night. Use fluoride toothpaste. Floss your teeth each day. Get dental cleanings as often as told by your dentist. Think about getting dental sealant put on teeth that have deep holes (decay). Drink water that has fluoride in it. Most tap water has fluoride. Check the label on bottled water to see if it has fluoride in it. Drink water instead of sugary drinks. Eat healthy meals and snacks. Wear a mouth guard or face shield when you play sports. Contact a doctor if: Your pain is worse and medicine does not help. Get help right away if: You have a fever or chills. Your symptoms suddenly get worse. You have a very bad headache. You have problems breathing or swallowing. You have trouble opening your mouth. You have swelling in your neck or close to your eye. These symptoms may be an emergency. Get help right away. Call your local emergency services (911 in the [place] Do not wait to see if the symptoms will go away. Do not drive yourself to the hospital. Summary A dental abscess is an area of pus in or around a tooth. It is caused by an infection. Treatment will help with symptoms and prevent the infection from spreading. Take over-the-counter and prescription medicines only as told by your dentist. To prevent an abscess, take good care of your teeth. Brush your teeth every morning and night. Use  floss every day. Get dental cleanings as often as told by your dentist. This information is not intended to replace advice given to you by your health care provider. Make sure you discuss any questions you have with your health care provider. Document Revised: 01/05/2021 Document Reviewed: 01/06/2021 Elsevier Patient Education  2024 Elsevier Inc.   If you have been instructed to have an in-person evaluation today at a local Urgent Care facility, please use the link below. It will take you to a list of all of our available [facility], including address, phone number and hours of operation. Please do not delay care.  [facility]  If you or a family member do not have a primary care provider, use the link below to schedule a visit and establish care. When you choose a [facility] primary care physician or advanced practice provider, you gain a long-term partner in health. Find a Primary Care Provider  Learn more about [facility]'s in-office and virtual care options: [facility] - Get Care Now

## 2024-09-01 ENCOUNTER — Telehealth: Admitting: Physician Assistant

## 2024-09-01 ENCOUNTER — Encounter: Payer: Self-pay | Admitting: Physician Assistant

## 2024-09-01 DIAGNOSIS — B9689 Other specified bacterial agents as the cause of diseases classified elsewhere: Secondary | ICD-10-CM | POA: Diagnosis not present

## 2024-09-01 DIAGNOSIS — N760 Acute vaginitis: Secondary | ICD-10-CM

## 2024-09-01 DIAGNOSIS — K047 Periapical abscess without sinus: Secondary | ICD-10-CM

## 2024-09-01 MED ORDER — AMOXICILLIN-POT CLAVULANATE 875-125 MG PO TABS: 1.0000 | ORAL_TABLET | Freq: Two times a day (BID) | ORAL | 0 refills | Status: AC

## 2024-09-01 MED ORDER — METRONIDAZOLE 500 MG PO TABS: 500.0000 mg | ORAL_TABLET | Freq: Two times a day (BID) | ORAL | 0 refills | Status: AC

## 2024-09-01 NOTE — Progress Notes (Signed)
 Virtual Visit Consent   Rebecca [name], you are scheduled for a virtual visit with a [facility] provider today. Just as with appointments in the office, your consent must be obtained to participate. Your consent will be active for this visit and any virtual visit you may have with one of our providers in the next 365 days. If you have a MyChart account, a copy of this consent can be sent to you electronically.  As this is a virtual visit, video technology does not allow for your provider to perform a traditional examination. This may limit your provider's ability to fully assess your condition. If your provider identifies any concerns that need to be evaluated in person or the need to arrange testing (such as labs, EKG, etc.), we will make arrangements to do so. Although advances in technology are sophisticated, we cannot ensure that it will always work on either your end or our end. If the connection with a video visit is poor, the visit may have to be switched to a telephone visit. With either a video or telephone visit, we are not always able to ensure that we have a secure connection.  By engaging in this virtual visit, you consent to the provision of healthcare and authorize for your insurance to be billed (if applicable) for the services provided during this visit. Depending on your insurance coverage, you may receive a charge related to this service.  I need to obtain your verbal consent now. Are you willing to proceed with your visit today? Rebecca [name] has provided verbal consent on 09/01/2024 for a virtual visit (video or telephone). [name], PA-C  Date: 09/01/2024 10:08 AM   Virtual Visit via Video Note   I, [name], connected with  Rebecca [name]  ([id number], 1985/11/26) on 09/01/24 at 10:00 AM EDT by a video-enabled telemedicine application and verified that I am speaking with the correct person using two identifiers.  Location: Patient: Virtual Visit  Location Patient: Home Provider: Virtual Visit Location Provider: Home Office   I discussed the limitations of evaluation and management by telemedicine and the availability of in person appointments. The patient expressed understanding and agreed to proceed.    History of Present Illness: Rebecca [name] is a 38 y.o. who identifies as a female who was assigned female at birth, and is being seen today for BV and dental infection. Seen virtually on 08/15/24 and was given Clindamycin to try to cover both issues. Patient did not tolerate and reports possible eye swelling with Clindamycin. She did not complete the week-long treatment. She follows up today for these issues to obtain new treatment.   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   NSVD (normal spontaneous vaginal delivery) 12/03/2014   Post-dates pregnancy 12/02/2014   Supervision of other normal pregnancy 06/17/2014   Smoker 10/29/2013    Allergies:  Allergies  Allergen Reactions   Clindamycin/Lincomycin Swelling    Possible swelling of the eyes   Medications:  Current Outpatient Medications:    amoxicillin-clavulanate (AUGMENTIN) 875-125 MG tablet, Take 1 tablet by mouth 2 (two) times daily., Disp: 14 tablet, Rfl: 0   metroNIDAZOLE  (FLAGYL ) 500 MG tablet, Take 1 tablet (500 mg total) by mouth 2 (two) times daily for 7 days., Disp: 14 tablet, Rfl: 0   cetirizine  (ZYRTEC ) 10 MG tablet, Take 1 tablet (10 mg total) by mouth daily. (Patient not taking: Reported on 01/22/2024), Disp: 30  tablet, Rfl: 2   docusate sodium  (COLACE) 100 MG capsule, Take 1 capsule (100 mg total) by mouth every 12 (twelve) hours. (Patient not taking: Reported on 01/22/2024), Disp: 60 capsule, Rfl: 0  Observations/Objective: Patient is well-developed, well-nourished in no acute distress.  Resting comfortably at home.  Head is normocephalic, atraumatic.  No labored breathing.  Speech is clear and  coherent with logical content.  Patient is alert and oriented at baseline.    Assessment and Plan: 1. Dental infection (Primary) - amoxicillin-clavulanate (AUGMENTIN) 875-125 MG tablet; Take 1 tablet by mouth 2 (two) times daily.  Dispense: 14 tablet; Refill: 0  - Suspected infection - Augmentin prescribed - Can use ice on outside jaw/cheek for swelling - Can also take tylenol  for pain with other medications - Discussed DenTemp putty that can be used to cover a broken tooth - Schedule a follow with a dentist as soon as possible (Can contact [facility] if underinsured or uninsured) - Seek in person evaluation if symptoms fail to improve or if they worsen  2. BV (bacterial vaginosis) - metroNIDAZOLE  (FLAGYL ) 500 MG tablet; Take 1 tablet (500 mg total) by mouth 2 (two) times daily for 7 days.  Dispense: 14 tablet; Refill: 0  - Symptoms consistent with BV - Metronidazole  prescribed - Limit bubble baths, scented lotions/soaps/detergents - Limit tight fitting clothing - Seek on person evaluation if not improving or if symptoms worsen   Follow Up Instructions: I discussed the assessment and treatment plan with the patient. The patient was provided an opportunity to ask questions and all were answered. The patient agreed with the plan and demonstrated an understanding of the instructions.  A copy of instructions were sent to the patient via MyChart unless otherwise noted below.    The patient was advised to call back or seek an in-person evaluation if the symptoms worsen or if the condition fails to improve as anticipated.    [name], PA-C

## 2024-09-01 NOTE — Patient Instructions (Signed)
 [name], thank you for joining [name], PA-C for today's virtual visit.  While this provider is not your primary [name] provider (PCP), if your PCP is located in our provider database this encounter information will be shared with them immediately following your visit.   A [facility] MyChart account gives you access to today's visit and all your visits, tests, and labs performed at [name]  click here if you don't have a [name] MyChart account or go to [email or web address]  Consent: (Patient) [name] provided verbal consent for this virtual visit at the beginning of the encounter.  Current Medications:  Current Outpatient Medications:    amoxicillin-clavulanate (AUGMENTIN) 875-125 MG tablet, Take 1 tablet by mouth 2 (two) times daily., Disp: 14 tablet, Rfl: 0   metroNIDAZOLE  (FLAGYL ) 500 MG tablet, Take 1 tablet (500 mg total) by mouth 2 (two) times daily for 7 days., Disp: 14 tablet, Rfl: 0   cetirizine  (ZYRTEC ) 10 MG tablet, Take 1 tablet (10 mg total) by mouth daily. (Patient not taking: Reported on 01/22/2024), Disp: 30 tablet, Rfl: 2   docusate sodium  (COLACE) 100 MG capsule, Take 1 capsule (100 mg total) by mouth every 12 (twelve) hours. (Patient not taking: Reported on 01/22/2024), Disp: 60 capsule, Rfl: 0   Medications ordered in this encounter:  Meds ordered this encounter  Medications   amoxicillin-clavulanate (AUGMENTIN) 875-125 MG tablet    Sig: Take 1 tablet by mouth 2 (two) times daily.    Dispense:  14 tablet    Refill:  0    Supervising Provider:   LAMPTEY, PHILIP O [[id number]]   metroNIDAZOLE  (FLAGYL ) 500 MG tablet    Sig: Take 1 tablet (500 mg total) by mouth 2 (two) times daily for 7 days.    Dispense:  14 tablet    Refill:  0    Supervising Provider:   BLAISE ALEENE KIDD [[id number]]     *If you need refills on other medications prior to your next appointment, please contact your pharmacy*  Follow-Up: Call back or  seek an in-person evaluation if the symptoms worsen or if the condition fails to improve as anticipated.  [name] [phone]  Other Instructions Vaginal Probiotics: AZO vaginal probiotic OLLY Happy Hoo-Ha RAW Vaginal [name] RenewLife Women's vaginal probiotic RepHresh Pro-B  Vaginal washes: Honey Pot Summer's Eve Vagisil Feminine cleanser  Boric Acid Suppositories  Healthy vaginal hygiene practices    -  Avoid sleeper pajamas. Nightgowns allow air to circulate.  Sleep without underpants whenever possible.   -  Wear cotton underpants during the day. Double-rinse underwear after washing to avoid residual irritants. Do not use fabric softeners for underwear and swimsuits.   - Avoid tights, leotards, leggings, skinny jeans, and other tight-fitting clothing. Skirts and loose-fitting pants allow air to circulate.   - Avoid pantyliners.  Instead use tampons or cotton pads.   - Use the restroom after intercourse to help prevent UTI's   - Daily warm bathing is helpful:     - Soak in clean water (no soap) for 10 to 15 minutes. Adding vinegar or baking soda to the water has not been specifically studied and may not be better than clean water alone.      - Use soap to wash regions other than the genital area just before getting out of the tub. Limit use of any soap on genital areas. Use fragance-free soaps.     - Rinse the genital area well and gently  pat dry.  Don't rub.  Hair dryer to assist with drying can be used only if on cool setting.     - Do not use bubble baths or perfumed soaps.   - Do not use any feminine sprays, douches or powders.  These contain chemicals that will irritate the skin.   - If the genital area is tender or swollen, cool compresses may relieve the discomfort. Unscented wet wipes can be used instead of toilet paper for wiping.    - Emollients, such as Vaseline, may help protect skin and can be applied to the irritated area.   - Always  remember to wipe front-to-back after bowel movements. Pat dry after urination.   - Do not sit in wet swimsuits for long periods of time after swimming    If you have been instructed to have an in-person evaluation today at a local [name] facility, please use the link below. It will take you to a list of all of our available [name], including address, phone number and hours of operation. Please do not delay [name].  [name]  If you or a family member do not have a primary [name] provider, use the link below to schedule a visit and establish [name]. When you choose a [name] primary [name] physician or advanced practice provider, you gain a long-term partner in health. Find a Primary [name] Provider  Learn more about [name]'s in-office and virtual [name] options: [facility] - Get [name] Now

## 2024-11-25 ENCOUNTER — Ambulatory Visit: Admitting: Family Medicine
